# Patient Record
Sex: Male | Born: 1970 | Race: Asian | Hispanic: No | Marital: Married | Smoking: Current some day smoker
Health system: Southern US, Community
[De-identification: ages and names within clinical notes are randomized; demographics above are authoritative.]

## PROBLEM LIST (undated history)

## (undated) DIAGNOSIS — Z789 Other specified health status: Secondary | ICD-10-CM

---

## 1999-01-24 ENCOUNTER — Emergency Department (HOSPITAL_COMMUNITY): Admission: EM | Admit: 1999-01-24 | Discharge: 1999-01-24 | Payer: Self-pay | Admitting: Emergency Medicine

## 1999-01-24 ENCOUNTER — Encounter: Payer: Self-pay | Admitting: Emergency Medicine

## 1999-04-03 ENCOUNTER — Encounter: Payer: Self-pay | Admitting: Urology

## 1999-04-03 ENCOUNTER — Ambulatory Visit (HOSPITAL_COMMUNITY): Admission: RE | Admit: 1999-04-03 | Discharge: 1999-04-03 | Payer: Self-pay | Admitting: Urology

## 2009-07-02 ENCOUNTER — Encounter: Admission: RE | Admit: 2009-07-02 | Discharge: 2009-07-02 | Payer: Self-pay | Admitting: Family Medicine

## 2009-08-20 ENCOUNTER — Encounter: Admission: RE | Admit: 2009-08-20 | Discharge: 2009-08-20 | Payer: Self-pay | Admitting: Diagnostic Radiology

## 2009-08-27 ENCOUNTER — Encounter: Admission: RE | Admit: 2009-08-27 | Discharge: 2009-08-27 | Payer: Self-pay | Admitting: Interventional Radiology

## 2009-09-17 ENCOUNTER — Encounter: Admission: RE | Admit: 2009-09-17 | Discharge: 2009-09-17 | Payer: Self-pay | Admitting: Diagnostic Radiology

## 2009-09-24 ENCOUNTER — Encounter: Admission: RE | Admit: 2009-09-24 | Discharge: 2009-09-24 | Payer: Self-pay | Admitting: Interventional Radiology

## 2009-10-22 ENCOUNTER — Encounter: Admission: RE | Admit: 2009-10-22 | Discharge: 2009-10-22 | Payer: Self-pay | Admitting: Diagnostic Radiology

## 2009-11-19 ENCOUNTER — Encounter: Admission: RE | Admit: 2009-11-19 | Discharge: 2009-11-19 | Payer: Self-pay | Admitting: Diagnostic Radiology

## 2010-05-06 ENCOUNTER — Encounter: Admission: RE | Admit: 2010-05-06 | Discharge: 2010-05-06 | Payer: Self-pay | Admitting: Diagnostic Radiology

## 2010-11-09 ENCOUNTER — Encounter: Payer: Self-pay | Admitting: Diagnostic Radiology

## 2012-12-14 ENCOUNTER — Emergency Department (HOSPITAL_COMMUNITY)
Admission: EM | Admit: 2012-12-14 | Discharge: 2012-12-15 | Disposition: A | Discharge status: Home / Self Care | Payer: Managed Care, Other (non HMO) | Attending: Emergency Medicine | Admitting: Emergency Medicine

## 2012-12-14 ENCOUNTER — Encounter (HOSPITAL_COMMUNITY): Payer: Self-pay | Admitting: *Deleted

## 2012-12-14 ENCOUNTER — Emergency Department (HOSPITAL_COMMUNITY): Payer: Managed Care, Other (non HMO)

## 2012-12-14 DIAGNOSIS — Z87891 Personal history of nicotine dependence: Secondary | ICD-10-CM | POA: Insufficient documentation

## 2012-12-14 DIAGNOSIS — N200 Calculus of kidney: Secondary | ICD-10-CM | POA: Insufficient documentation

## 2012-12-14 LAB — URINALYSIS, ROUTINE W REFLEX MICROSCOPIC
Glucose, UA: NEGATIVE mg/dL
Ketones, ur: NEGATIVE mg/dL
Leukocytes, UA: NEGATIVE
Specific Gravity, Urine: 1.023 (ref 1.005–1.030)
Urobilinogen, UA: 0.2 mg/dL (ref 0.0–1.0)
pH: 6 (ref 5.0–8.0)

## 2012-12-14 LAB — CBC WITH DIFFERENTIAL/PLATELET
Basophils Absolute: 0 10*3/uL (ref 0.0–0.1)
Eosinophils Absolute: 0.6 10*3/uL (ref 0.0–0.7)
Eosinophils Relative: 4 % (ref 0–5)
Hemoglobin: 18.9 g/dL — ABNORMAL HIGH (ref 13.0–17.0)
Lymphocytes Relative: 12 % (ref 12–46)
MCHC: 36 g/dL (ref 30.0–36.0)
MCV: 87.8 fL (ref 78.0–100.0)
Monocytes Absolute: 1 10*3/uL (ref 0.1–1.0)
RBC: 5.98 MIL/uL — ABNORMAL HIGH (ref 4.22–5.81)
RDW: 12.3 % (ref 11.5–15.5)
WBC: 15.6 10*3/uL — ABNORMAL HIGH (ref 4.0–10.5)

## 2012-12-14 LAB — BASIC METABOLIC PANEL
BUN: 15 mg/dL (ref 6–23)
Creatinine, Ser: 1.05 mg/dL (ref 0.50–1.35)
GFR calc Af Amer: >90 mL/min (ref >90)
GFR calc non Af Amer: 85 mL/min — ABNORMAL LOW (ref >90)

## 2012-12-14 LAB — URINE MICROSCOPIC-ADD ON

## 2012-12-14 MED ORDER — HYDROMORPHONE HCL PF 1 MG/ML IJ SOLN: 0.5000 mg | Freq: Once | INTRAMUSCULAR | Status: DC

## 2012-12-14 MED ORDER — MORPHINE SULFATE 4 MG/ML IJ SOLN
4.0000 mg | Freq: Once | INTRAMUSCULAR | Status: AC
  Administered 2012-12-14: 4 mg via INTRAVENOUS
  Filled 2012-12-14: qty 1

## 2012-12-14 MED ORDER — ONDANSETRON HCL 4 MG/2ML IJ SOLN
4.0000 mg | Freq: Once | INTRAMUSCULAR | Status: AC
  Administered 2012-12-14: 4 mg via INTRAVENOUS
  Filled 2012-12-14: qty 2

## 2012-12-14 NOTE — ED Notes (Signed)
Provider held off on lab work.

## 2012-12-14 NOTE — ED Notes (Signed)
Per [place], [place].Waiting 30 minutes to see if pt's pain return before collecting blood sample.

## 2012-12-14 NOTE — ED Notes (Signed)
Per pt report: Pt reports pain on his left flank that began around 8pm this evening.  Pt reports having similar pain in the past and was dx with kidney stone.  The pain doesn't radiate.  Pt denies pain or burning upon urination or blood in urine.

## 2012-12-14 NOTE — ED Provider Notes (Signed)
History     CSN: [id number]  Arrival date & time 12/14/12  2149   First MD Initiated Contact with Patient 12/14/12 2214      Chief Complaint  Patient presents with  . Flank Pain    (Consider location/radiation/quality/duration/timing/severity/associated sxs/prior treatment) HPI Pt to the ED with complaints of left flank pain that started at 8pm while at work but has resolved right as he was placed in a room in the ED. He says that he has a history of kidney stones and this felt just like the same. No noticeable blood in urine, no dysuria, no vomiting, nausea, diarrhea or headache. He says that the pain was dull and stabbing and and a 8-9/10. NO he says his pain is a 0/10. He feels like he wants to go now and does not want any lab work done because the pain is  Gone. vss nad   History reviewed. No pertinent past medical history.  History reviewed. No pertinent past surgical history.  No family history on file.  History  Substance Use Topics  . Smoking status: Former Games developer  . Smokeless tobacco: Not on file  . Alcohol Use: Yes     Comment: "sometimes."      Review of Systems  Review of Systems  Gen: no weight loss, fevers, chills, night sweats  Eyes: no discharge or drainage, no occular pain or visual changes  Nose: no epistaxis or rhinorrhea  Mouth: no dental pain, no sore throat  Neck: no neck pain  Lungs:No wheezing, coughing or hemoptysis CV: no chest pain, palpitations, dependent edema or orthopnea  Abd: no abdominal pain, nausea, vomiting  GU: no dysuria or gross hematuria + flank pain MSK:  No abnormalities  Neuro: no headache, no focal neurologic deficits  Skin: no abnormalities Psyche: negative.   Allergies  Review of patient's allergies indicates no known allergies.  Home Medications   Current Outpatient Rx  Name  Route  Sig  Dispense  Refill  . acetaminophen (TYLENOL) 500 MG tablet   Oral   Take 500-1,000 mg by mouth every 6 (six) hours as needed  for pain.         Marland Kitchen ibuprofen (ADVIL,MOTRIN) 200 MG tablet   Oral   Take 200 mg by mouth every 6 (six) hours as needed for pain.         Marland Kitchen ondansetron (ZOFRAN) 4 MG tablet   Oral   Take 1 tablet (4 mg total) by mouth every 6 (six) hours.   12 tablet   0   . oxyCODONE-acetaminophen (PERCOCET/ROXICET) 5-325 MG per tablet   Oral   Take 1 tablet by mouth every 6 (six) hours as needed for pain.   15 tablet   0     BP 137/82  Pulse 69  Temp(Src) 98.1 F (36.7 C) (Oral)  Ht 5\' 5"  (1.651 m)  Wt 158 lb (71.668 kg)  BMI 26.29 kg/m2  SpO2 100%  Physical Exam  Nursing note and vitals reviewed. Constitutional: He appears well-developed and well-nourished. No distress.  HENT:  Head: Normocephalic and atraumatic.  Eyes: Pupils are equal, round, and reactive to light.  Neck: Normal range of motion. Neck supple.  Cardiovascular: Normal rate and regular rhythm.   Pulmonary/Chest: Effort normal.  Abdominal: Soft. There is no CVA tenderness.  Neurological: He is alert.  Skin: Skin is warm and dry.    ED Course  Procedures (including critical care time)  Labs Reviewed  URINALYSIS, ROUTINE W REFLEX MICROSCOPIC - Abnormal;  Notable for the following:    Hgb urine dipstick LARGE (*)    All other components within normal limits  CBC WITH DIFFERENTIAL - Abnormal; Notable for the following:    WBC 15.6 (*)    RBC 5.98 (*)    Hemoglobin 18.9 (*)    HCT 52.5 (*)    Neutro Abs 12.1 (*)    All other components within normal limits  BASIC METABOLIC PANEL - Abnormal; Notable for the following:    GFR calc non Af Amer 85 (*)    All other components within normal limits  URINE MICROSCOPIC-ADD ON - Abnormal; Notable for the following:    Crystals CA OXALATE CRYSTALS (*)    All other components within normal limits   Ct Abdomen Pelvis Wo Contrast  12/15/2012  *RADIOLOGY REPORT*  Clinical Data: 42 year old male with left flank, abdominal and pelvic pain.  CT ABDOMEN AND PELVIS WITHOUT  CONTRAST  Technique:  Multidetector CT imaging of the abdomen and pelvis was performed following the standard protocol without intravenous contrast.  Comparison: None  Findings: A 1 mm left UVJ calculus causes mild left hydroureteronephrosis. Cholelithiasis noted without CT evidence of acute cholecystitis.  The liver, spleen, pancreas, right kidney and adrenal glands are unremarkable.  Please note that parenchymal abnormalities may be missed as intravenous contrast was not administered.  No free fluid, enlarged lymph nodes, biliary dilation or abdominal aortic aneurysm identified.  The bowel and appendix are unremarkable except for a few scattered diverticula. Prostate enlargement is noted. No acute or suspicious bony abnormalities are present.  IMPRESSION: 1 mm left UVJ calculus causing mild left hydroureteronephrosis.  Cholelithiasis without CT evidence of acute cholecystitis.   Original Report Authenticated By: Harmon Pier, M.D.      1. Kidney stone       MDM  Patient doesn't want any labs or images done because he is in pain. We discussed that he may have kidney stones but we wont know for sure without doing any images. He asks that we wait 30 minutes to see if the pain comes back and if it does not he would like to leave. He says he will come back and do more tests and images if the pain comes back.  Will recheck patient after 30 minutes at 11pm.  11:15pm- the patients pain has returned. Pain medication ordered, labs and CT scan of ab/pelv wo contrast.   12:25- CT scan shows 1 mm stone in left UVJ. pts pain controlled. Strain all urine, pain and nausea medication with referral to Urology. Incidental finding of cholelithiasis. Discussed all this with pt.  Pt has been advised of the symptoms that warrant their return to the ED. Patient has voiced understanding and has agreed to follow-up with the PCP or specialist.      Dorthula Matas, PA 12/15/12 [phone]

## 2012-12-15 MED ORDER — ONDANSETRON HCL 4 MG PO TABS: 4.0000 mg | ORAL_TABLET | Freq: Four times a day (QID) | ORAL | Status: DC

## 2012-12-15 MED ORDER — OXYCODONE-ACETAMINOPHEN 5-325 MG PO TABS
2.0000 | ORAL_TABLET | Freq: Once | ORAL | Status: AC
  Administered 2012-12-15: 2 via ORAL
  Filled 2012-12-15: qty 2

## 2012-12-15 MED ORDER — SODIUM CHLORIDE 0.9 % IV BOLUS (SEPSIS): 1000.0000 mL | Freq: Once | INTRAVENOUS | Status: DC

## 2012-12-15 MED ORDER — OXYCODONE-ACETAMINOPHEN 5-325 MG PO TABS: 1.0000 | ORAL_TABLET | Freq: Four times a day (QID) | ORAL | Status: DC | PRN

## 2012-12-16 NOTE — ED Provider Notes (Signed)
Medical screening examination/treatment/procedure(s) were performed by non-physician practitioner and as supervising physician I was immediately available for consultation/collaboration.   Jaxxen Voong, MD 12/16/12 0031 

## 2013-06-15 ENCOUNTER — Emergency Department (HOSPITAL_COMMUNITY): Payer: Managed Care, Other (non HMO)

## 2013-06-15 ENCOUNTER — Observation Stay (HOSPITAL_COMMUNITY)
Admission: EM | Admit: 2013-06-15 | Discharge: 2013-06-17 | Disposition: A | Discharge status: Home / Self Care | Payer: Managed Care, Other (non HMO) | Attending: General Surgery | Admitting: General Surgery

## 2013-06-15 ENCOUNTER — Encounter (HOSPITAL_COMMUNITY): Payer: Self-pay | Admitting: *Deleted

## 2013-06-15 DIAGNOSIS — K819 Cholecystitis, unspecified: Secondary | ICD-10-CM

## 2013-06-15 DIAGNOSIS — K8010 Calculus of gallbladder with chronic cholecystitis without obstruction: Principal | ICD-10-CM | POA: Insufficient documentation

## 2013-06-15 HISTORY — DX: Other specified health status: Z78.9

## 2013-06-15 LAB — CBC WITH DIFFERENTIAL/PLATELET
Basophils Absolute: 0 10*3/uL (ref 0.0–0.1)
Eosinophils Absolute: 0.5 10*3/uL (ref 0.0–0.7)
Eosinophils Relative: 5 % (ref 0–5)
Lymphs Abs: 2.6 10*3/uL (ref 0.7–4.0)
MCH: 30.9 pg (ref 26.0–34.0)
MCV: 87.3 fL (ref 78.0–100.0)
Monocytes Absolute: 0.6 10*3/uL (ref 0.1–1.0)
Platelets: 219 10*3/uL (ref 150–400)
RDW: 12.3 % (ref 11.5–15.5)

## 2013-06-15 LAB — URINALYSIS, ROUTINE W REFLEX MICROSCOPIC
Hgb urine dipstick: NEGATIVE
Leukocytes, UA: NEGATIVE
Nitrite: NEGATIVE
Protein, ur: NEGATIVE mg/dL
Specific Gravity, Urine: 1.032 — ABNORMAL HIGH (ref 1.005–1.030)
Urobilinogen, UA: 0.2 mg/dL (ref 0.0–1.0)

## 2013-06-15 LAB — COMPREHENSIVE METABOLIC PANEL
ALT: 32 U/L (ref 0–53)
Calcium: 9.6 mg/dL (ref 8.4–10.5)
Creatinine, Ser: 0.66 mg/dL (ref 0.50–1.35)
GFR calc Af Amer: >90 mL/min (ref >90)
Glucose, Bld: 134 mg/dL — ABNORMAL HIGH (ref 70–99)
Sodium: 138 mEq/L (ref 135–145)
Total Protein: 7 g/dL (ref 6.0–8.3)

## 2013-06-15 MED ORDER — KCL IN DEXTROSE-NACL 20-5-0.9 MEQ/L-%-% IV SOLN
INTRAVENOUS | Status: DC
  Administered 2013-06-15 – 2013-06-16 (×2): via INTRAVENOUS
  Filled 2013-06-15 (×5): qty 1000

## 2013-06-15 MED ORDER — ONDANSETRON HCL 4 MG/2ML IJ SOLN: 4.0000 mg | Freq: Four times a day (QID) | INTRAMUSCULAR | Status: DC | PRN

## 2013-06-15 MED ORDER — HYDROMORPHONE HCL PF 1 MG/ML IJ SOLN
1.0000 mg | INTRAMUSCULAR | Status: DC | PRN
  Administered 2013-06-16: 1 mg via INTRAVENOUS
  Filled 2013-06-15: qty 1

## 2013-06-15 MED ORDER — ENOXAPARIN SODIUM 40 MG/0.4ML ~~LOC~~ SOLN
40.0000 mg | SUBCUTANEOUS | Status: DC
  Administered 2013-06-15: 40 mg via SUBCUTANEOUS
  Filled 2013-06-15 (×2): qty 0.4

## 2013-06-15 MED ORDER — CIPROFLOXACIN IN D5W 400 MG/200ML IV SOLN
400.0000 mg | Freq: Two times a day (BID) | INTRAVENOUS | Status: DC
  Administered 2013-06-15: 400 mg via INTRAVENOUS
  Filled 2013-06-15 (×3): qty 200

## 2013-06-15 MED ORDER — OXYCODONE HCL 5 MG PO TABS: 5.0000 mg | ORAL_TABLET | ORAL | Status: DC | PRN

## 2013-06-15 NOTE — ED Notes (Signed)
[facility] faxed over pt's paper work from today's visit

## 2013-06-15 NOTE — ED Provider Notes (Signed)
CSN: [id number]     Arrival date & time 06/15/13  1443 History   First MD Initiated Contact with Patient 06/15/13 1531     Chief Complaint  Patient presents with  . Abdominal Pain   (Consider location/radiation/quality/duration/timing/severity/associated sxs/prior Treatment) Patient is a 42 y.o. male presenting with abdominal pain. The history is provided by the patient.  Abdominal Pain Pain location:  Epigastric Associated symptoms: no chest pain, no diarrhea, no nausea, no shortness of breath and no vomiting    patient with upper abdominal pain that began this morning after eating. It was dull and then resolved. He had another episode after sleeping. He has not eaten since this morning. He had seen at [facility] clinic and sent here. He had a white count of 14 there. No nausea vomiting. No fevers. She had a previous CAT scan that showed a gallstone. He states the pain is improved. No blood in stool. No fevers.  Past Medical History  Diagnosis Date  . Medical history non-contributory    Past Surgical History  Procedure Laterality Date  . No past surgeries     History reviewed. No pertinent family history. History  Substance Use Topics  . Smoking status: Former Smoker    Types: Cigarettes    Quit date: 06/15/2009  . Smokeless tobacco: Never Used  . Alcohol Use: Yes     Comment: "sometimes."    Review of Systems  Constitutional: Negative for activity change and appetite change.  HENT: Negative for neck stiffness.   Eyes: Negative for pain.  Respiratory: Negative for chest tightness and shortness of breath.   Cardiovascular: Negative for chest pain and leg swelling.  Gastrointestinal: Positive for abdominal pain. Negative for nausea, vomiting and diarrhea.  Genitourinary: Negative for flank pain.  Musculoskeletal: Negative for back pain.  Skin: Negative for rash.  Neurological: Negative for weakness, numbness and headaches.  Psychiatric/Behavioral: Negative for behavioral  problems.    Allergies  Review of patient's allergies indicates no known allergies.  Home Medications  No current outpatient prescriptions on file. BP 124/76  Pulse 63  Temp(Src) 98.1 F (36.7 C) (Oral)  Resp 18  Ht 5\' 5"  (1.651 m)  Wt 150 lb 12.7 oz (68.4 kg)  BMI 25.09 kg/m2  SpO2 100% Physical Exam  Nursing note and vitals reviewed. Constitutional: He is oriented to person, place, and time. He appears well-developed and well-nourished.  HENT:  Head: Normocephalic and atraumatic.  Eyes: EOM are normal. Pupils are equal, round, and reactive to light.  Neck: Normal range of motion. Neck supple.  Cardiovascular: Normal rate, regular rhythm and normal heart sounds.   No murmur heard. Pulmonary/Chest: Effort normal and breath sounds normal.  Abdominal: Soft. Bowel sounds are normal. He exhibits no distension and no mass. There is no tenderness. There is no rebound and no guarding.  Musculoskeletal: Normal range of motion. He exhibits no edema.  Neurological: He is alert and oriented to person, place, and time. No cranial nerve deficit.  Skin: Skin is warm and dry.  Psychiatric: He has a normal mood and affect.    ED Course  Procedures (including critical care time) Labs Review Labs Reviewed  COMPREHENSIVE METABOLIC PANEL - Abnormal; Notable for the following:    Potassium 3.3 (*)    Glucose, Bld 134 (*)    Alkaline Phosphatase 33 (*)    All other components within normal limits  URINALYSIS, ROUTINE W REFLEX MICROSCOPIC - Abnormal; Notable for the following:    Specific Gravity, Urine 1.032 (*)  All other components within normal limits  SURGICAL PCR SCREEN  CBC WITH DIFFERENTIAL  LIPASE, BLOOD  CBC  COMPREHENSIVE METABOLIC PANEL   Imaging Review US Abdomen Complete  06/15/2013   *RADIOLOGY REPORT*  Clinical Data:  Right upper quadrant abdominal pain.  COMPLETE ABDOMINAL ULTRASOUND  Comparison:  Unenhanced CT abdomen and pelvis 12/14/2012.  Findings:  Gallbladder:   Multiple shadowing gallstones, the largest approximating 1.7 cm.  Gallbladder wall thickening up to approximately 5 mm.  No pericholecystic fluid.  Negative sonographic Murphy's sign according to the ultrasound technologist.  Common bile duct:  Normal in caliber with maximum diameter approximating 3 mm.  Possible stone in the common hepatic duct as it enters the liver, as there is an echogenic focus in this location.  Liver:  Normal size and echotexture without focal parenchymal abnormality.  Patent portal vein with hepatopetal flow.  IVC:  Patent in its intrahepatic portion.  Obscured outside the liver by bowel gas.  Pancreas:  Obscured by overlying bowel gas and therefore not evaluated.  Spleen:  Normal size and echotexture without focal parenchymal abnormality.  Right Kidney:  No hydronephrosis.  Well-preserved cortex.  No shadowing calculi.  Normal size and parenchymal echotexture without focal abnormalities.  Approximately 11.2 cm in length.  Left Kidney:  No hydronephrosis.  Well-preserved cortex.  No shadowing calculi.  Normal size and parenchymal echotexture without focal abnormalities.  Approximately 11.8 cm length.  Abdominal aorta:  Normal in caliber proximally, though obscured in its mid and distal portion by overlying bowel gas.  IMPRESSION:  1.  Cholelithiasis.  Gallbladder wall thickening is consistent with cholecystitis. 2.  No biliary ductal dilation, though there may be a stone in the common hepatic duct. 3.  Otherwise normal examination with a caveat that the pancreas, the extrahepatic IVC and the mid and distal abdominal aorta were obscured by overlying bowel gas and were therefore not evaluated.   Original Report Authenticated By: Hulan Saas, M.D.    MDM   1. Cholecystitis    Patient with abdominal pain. No longer tender. Ultrasound shows cholecystitis. Will be admitted by general surgery    Juliet Rude. Rubin Payor, MD 06/16/13 [phone]

## 2013-06-15 NOTE — ED Notes (Signed)
Pt c/o abd pain "all over" and painful urination. Was seen at [facility] today, pt states he is having surgery for his gallbladder. States physician from [facility] sent him here and surgeon would be here to evaluate him.

## 2013-06-15 NOTE — H&P (Signed)
Clinton Castillo is an 42 y.o. male.   Chief Complaint: abdominal pain HPI: asked to see pt at request of Dr Rubin Payor for RUQ abdominal pain.  Started today. Positive nausea and vomiting.  Feels better now.  U/S shows GB wall thickening and gallstones.  Question CBD/ CHD stone.   Past Medical History  Diagnosis Date  . Medical history non-contributory     Past Surgical History  Procedure Laterality Date  . No past surgeries      History reviewed. No pertinent family history. Social History:  reports that he quit smoking about 4 years ago. His smoking use included Cigarettes. He smoked 0.00 packs per day. He has never used smokeless tobacco. He reports that  drinks alcohol. He reports that he does not use illicit drugs.  Allergies: No Known Allergies   (Not in a hospital admission)  Results for orders placed during the hospital encounter of 06/15/13 (from the past 48 hour(s))  URINALYSIS, ROUTINE W REFLEX MICROSCOPIC     Status: Abnormal   Collection Time    06/15/13  3:54 PM      Result Value Range   Color, Urine YELLOW  YELLOW   APPearance CLEAR  CLEAR   Specific Gravity, Urine 1.032 (*) 1.005 - 1.030   pH 5.5  5.0 - 8.0   Glucose, UA NEGATIVE  NEGATIVE mg/dL   Hgb urine dipstick NEGATIVE  NEGATIVE   Bilirubin Urine NEGATIVE  NEGATIVE   Ketones, ur NEGATIVE  NEGATIVE mg/dL   Protein, ur NEGATIVE  NEGATIVE mg/dL   Urobilinogen, UA 0.2  0.0 - 1.0 mg/dL   Nitrite NEGATIVE  NEGATIVE   Leukocytes, UA NEGATIVE  NEGATIVE   Comment: MICROSCOPIC NOT DONE ON URINES WITH NEGATIVE PROTEIN, BLOOD, LEUKOCYTES, NITRITE, OR GLUCOSE <1000 mg/dL.  CBC WITH DIFFERENTIAL     Status: None   Collection Time    06/15/13  4:07 PM      Result Value Range   WBC 9.7  4.0 - 10.5 K/uL   RBC 5.50  4.22 - 5.81 MIL/uL   Hemoglobin 17.0  13.0 - 17.0 g/dL   HCT 29.5  62.1 - 30.8 %   MCV 87.3  78.0 - 100.0 fL   MCH 30.9  26.0 - 34.0 pg   MCHC 35.4  30.0 - 36.0 g/dL   RDW 65.7  84.6 - 96.2 %   Platelets  219  150 - 400 K/uL   Neutrophils Relative % 62  43 - 77 %   Neutro Abs 6.0  1.7 - 7.7 K/uL   Lymphocytes Relative 27  12 - 46 %   Lymphs Abs 2.6  0.7 - 4.0 K/uL   Monocytes Relative 6  3 - 12 %   Monocytes Absolute 0.6  0.1 - 1.0 K/uL   Eosinophils Relative 5  0 - 5 %   Eosinophils Absolute 0.5  0.0 - 0.7 K/uL   Basophils Relative 0  0 - 1 %   Basophils Absolute 0.0  0.0 - 0.1 K/uL  COMPREHENSIVE METABOLIC PANEL     Status: Abnormal   Collection Time    06/15/13  4:07 PM      Result Value Range   Sodium 138  135 - 145 mEq/L   Potassium 3.3 (*) 3.5 - 5.1 mEq/L   Chloride 104  96 - 112 mEq/L   CO2 27  19 - 32 mEq/L   Glucose, Bld 134 (*) 70 - 99 mg/dL   BUN 12  6 - 23  mg/dL   Creatinine, Ser 1.61  0.50 - 1.35 mg/dL   Calcium 9.6  8.4 - 09.6 mg/dL   Total Protein 7.0  6.0 - 8.3 g/dL   Albumin 3.8  3.5 - 5.2 g/dL   AST 28  0 - 37 U/L   ALT 32  0 - 53 U/L   Alkaline Phosphatase 33 (*) 39 - 117 U/L   Total Bilirubin 0.5  0.3 - 1.2 mg/dL   GFR calc non Af Amer >90  >90 mL/min   GFR calc Af Amer >90  >90 mL/min   Comment: (NOTE)     The eGFR has been calculated using the CKD EPI equation.     This calculation has not been validated in all clinical situations.     eGFR's persistently <90 mL/min signify possible Chronic Kidney     Disease.  LIPASE, BLOOD     Status: None   Collection Time    06/15/13  4:07 PM      Result Value Range   Lipase 40  11 - 59 U/L   US Abdomen Complete  06/15/2013   *RADIOLOGY REPORT*  Clinical Data:  Right upper quadrant abdominal pain.  COMPLETE ABDOMINAL ULTRASOUND  Comparison:  Unenhanced CT abdomen and pelvis 12/14/2012.  Findings:  Gallbladder:  Multiple shadowing gallstones, the largest approximating 1.7 cm.  Gallbladder wall thickening up to approximately 5 mm.  No pericholecystic fluid.  Negative sonographic Murphy's sign according to the ultrasound technologist.  Common bile duct:  Normal in caliber with maximum diameter approximating 3 mm.   Possible stone in the common hepatic duct as it enters the liver, as there is an echogenic focus in this location.  Liver:  Normal size and echotexture without focal parenchymal abnormality.  Patent portal vein with hepatopetal flow.  IVC:  Patent in its intrahepatic portion.  Obscured outside the liver by bowel gas.  Pancreas:  Obscured by overlying bowel gas and therefore not evaluated.  Spleen:  Normal size and echotexture without focal parenchymal abnormality.  Right Kidney:  No hydronephrosis.  Well-preserved cortex.  No shadowing calculi.  Normal size and parenchymal echotexture without focal abnormalities.  Approximately 11.2 cm in length.  Left Kidney:  No hydronephrosis.  Well-preserved cortex.  No shadowing calculi.  Normal size and parenchymal echotexture without focal abnormalities.  Approximately 11.8 cm length.  Abdominal aorta:  Normal in caliber proximally, though obscured in its mid and distal portion by overlying bowel gas.  IMPRESSION:  1.  Cholelithiasis.  Gallbladder wall thickening is consistent with cholecystitis. 2.  No biliary ductal dilation, though there may be a stone in the common hepatic duct. 3.  Otherwise normal examination with a caveat that the pancreas, the extrahepatic IVC and the mid and distal abdominal aorta were obscured by overlying bowel gas and were therefore not evaluated.   Original Report Authenticated By: Hulan Saas, M.D.    Review of Systems  Constitutional: Negative.   Eyes: Negative.   Respiratory: Negative.   Gastrointestinal: Positive for abdominal pain.  Genitourinary: Negative.   Musculoskeletal: Negative.   Skin: Negative.   Neurological: Negative.   Endo/Heme/Allergies: Negative.   Psychiatric/Behavioral: Negative.     Blood pressure 104/61, pulse 57, temperature 97.8 F (36.6 C), temperature source Oral, resp. rate 16, SpO2 98.00%. Physical Exam  Constitutional: He is oriented to person, place, and time. He appears well-developed and  well-nourished.  HENT:  Head: Normocephalic and atraumatic.  Eyes: EOM are normal. Pupils are equal, round, and reactive to  light. No scleral icterus.  Neck: Normal range of motion. Neck supple.  Cardiovascular: Normal rate and regular rhythm.   Respiratory: Effort normal and breath sounds normal.  GI: There is tenderness in the right upper quadrant.  Musculoskeletal: Normal range of motion.  Neurological: He is alert and oriented to person, place, and time.  Skin: Skin is warm and dry.  Psychiatric: He has a normal mood and affect. His behavior is normal. Judgment and thought content normal.     Assessment/Plan Acute cholecystitis Possible CBD/ CHD STONE Admit/ IVF/ NPO AFTER MIDNIGHT LAP CHOLE IN AM WITH CHOLANGIOGRAM  Clinton Castillo A. 06/15/2013, 7:48 PM

## 2013-06-16 ENCOUNTER — Inpatient Hospital Stay (HOSPITAL_COMMUNITY): Payer: Managed Care, Other (non HMO)

## 2013-06-16 ENCOUNTER — Encounter (HOSPITAL_COMMUNITY)
Admission: EM | Disposition: A | Discharge status: Home / Self Care | Payer: Self-pay | Source: Home / Self Care | Attending: Emergency Medicine

## 2013-06-16 ENCOUNTER — Encounter (HOSPITAL_COMMUNITY): Payer: Self-pay

## 2013-06-16 ENCOUNTER — Encounter (HOSPITAL_COMMUNITY): Payer: Self-pay | Admitting: Anesthesiology

## 2013-06-16 ENCOUNTER — Inpatient Hospital Stay (HOSPITAL_COMMUNITY): Payer: Managed Care, Other (non HMO) | Admitting: Anesthesiology

## 2013-06-16 HISTORY — PX: CHOLECYSTECTOMY: SHX55

## 2013-06-16 LAB — COMPREHENSIVE METABOLIC PANEL
ALT: 30 U/L (ref 0–53)
AST: 24 U/L (ref 0–37)
Albumin: 3.7 g/dL (ref 3.5–5.2)
CO2: 26 mEq/L (ref 19–32)
Chloride: 105 mEq/L (ref 96–112)
GFR calc non Af Amer: >90 mL/min (ref >90)
Potassium: 3.7 mEq/L (ref 3.5–5.1)
Sodium: 137 mEq/L (ref 135–145)
Total Bilirubin: 0.8 mg/dL (ref 0.3–1.2)

## 2013-06-16 LAB — CBC
Platelets: 228 10*3/uL (ref 150–400)
RBC: 5.53 MIL/uL (ref 4.22–5.81)
RDW: 12.4 % (ref 11.5–15.5)
WBC: 8.8 10*3/uL (ref 4.0–10.5)

## 2013-06-16 LAB — SURGICAL PCR SCREEN: Staphylococcus aureus: POSITIVE — AB

## 2013-06-16 SURGERY — LAPAROSCOPIC CHOLECYSTECTOMY WITH INTRAOPERATIVE CHOLANGIOGRAM
Anesthesia: General | Wound class: Clean Contaminated

## 2013-06-16 MED ORDER — BUPIVACAINE-EPINEPHRINE 0.25% -1:200000 IJ SOLN
INTRAMUSCULAR | Status: DC | PRN
  Administered 2013-06-16: 5 mL

## 2013-06-16 MED ORDER — SODIUM CHLORIDE 0.9 % IV SOLN: INTRAVENOUS | Status: DC

## 2013-06-16 MED ORDER — PROMETHAZINE HCL 25 MG/ML IJ SOLN: 6.2500 mg | INTRAMUSCULAR | Status: DC | PRN

## 2013-06-16 MED ORDER — ONDANSETRON HCL 4 MG/2ML IJ SOLN
INTRAMUSCULAR | Status: DC | PRN
  Administered 2013-06-16: 4 mg via INTRAVENOUS

## 2013-06-16 MED ORDER — GLYCOPYRROLATE 0.2 MG/ML IJ SOLN
INTRAMUSCULAR | Status: DC | PRN
  Administered 2013-06-16: 0.4 mg via INTRAVENOUS

## 2013-06-16 MED ORDER — OXYCODONE HCL 5 MG/5ML PO SOLN
5.0000 mg | Freq: Once | ORAL | Status: DC | PRN
  Filled 2013-06-16: qty 5

## 2013-06-16 MED ORDER — OXYCODONE-ACETAMINOPHEN 5-325 MG PO TABS
1.0000 | ORAL_TABLET | ORAL | Status: DC | PRN
  Administered 2013-06-16 – 2013-06-17 (×2): 1 via ORAL
  Filled 2013-06-16 (×2): qty 1

## 2013-06-16 MED ORDER — FENTANYL CITRATE 0.05 MG/ML IJ SOLN
INTRAMUSCULAR | Status: DC | PRN
  Administered 2013-06-16 (×2): 100 ug via INTRAVENOUS
  Administered 2013-06-16: 50 ug via INTRAVENOUS

## 2013-06-16 MED ORDER — DEXTROSE 5 % IV SOLN
1.0000 g | INTRAVENOUS | Status: DC | PRN
  Administered 2013-06-16: 2 g via INTRAVENOUS

## 2013-06-16 MED ORDER — MIDAZOLAM HCL 5 MG/5ML IJ SOLN
INTRAMUSCULAR | Status: DC | PRN
  Administered 2013-06-16: 2 mg via INTRAVENOUS

## 2013-06-16 MED ORDER — SUCCINYLCHOLINE CHLORIDE 20 MG/ML IJ SOLN
INTRAMUSCULAR | Status: DC | PRN
  Administered 2013-06-16: 100 mg via INTRAVENOUS

## 2013-06-16 MED ORDER — HYDROMORPHONE HCL PF 1 MG/ML IJ SOLN: 0.2500 mg | INTRAMUSCULAR | Status: DC | PRN

## 2013-06-16 MED ORDER — LACTATED RINGERS IR SOLN
Status: DC | PRN
  Administered 2013-06-16: 1

## 2013-06-16 MED ORDER — OXYCODONE HCL 5 MG PO TABS: 5.0000 mg | ORAL_TABLET | Freq: Once | ORAL | Status: DC | PRN

## 2013-06-16 MED ORDER — LACTATED RINGERS IV SOLN
INTRAVENOUS | Status: DC
  Administered 2013-06-16: 1000 mL via INTRAVENOUS
  Administered 2013-06-16: 11:00:00 via INTRAVENOUS

## 2013-06-16 MED ORDER — ROCURONIUM BROMIDE 100 MG/10ML IV SOLN
INTRAVENOUS | Status: DC | PRN
  Administered 2013-06-16: 35 mg via INTRAVENOUS

## 2013-06-16 MED ORDER — DEXAMETHASONE SODIUM PHOSPHATE 10 MG/ML IJ SOLN
INTRAMUSCULAR | Status: DC | PRN
  Administered 2013-06-16: 10 mg via INTRAVENOUS

## 2013-06-16 MED ORDER — PROPOFOL 10 MG/ML IV BOLUS
INTRAVENOUS | Status: DC | PRN
  Administered 2013-06-16: 200 mg via INTRAVENOUS

## 2013-06-16 MED ORDER — HYDROMORPHONE HCL PF 1 MG/ML IJ SOLN
INTRAMUSCULAR | Status: DC | PRN
  Administered 2013-06-16: .5 mg via INTRAVENOUS
  Administered 2013-06-16: 0.5 mg via INTRAVENOUS

## 2013-06-16 MED ORDER — SODIUM CHLORIDE 0.9 % IJ SOLN
INTRAMUSCULAR | Status: DC | PRN
  Administered 2013-06-16: 12:00:00

## 2013-06-16 MED ORDER — MEPERIDINE HCL 50 MG/ML IJ SOLN: 6.2500 mg | INTRAMUSCULAR | Status: DC | PRN

## 2013-06-16 MED ORDER — ENOXAPARIN SODIUM 40 MG/0.4ML ~~LOC~~ SOLN
40.0000 mg | SUBCUTANEOUS | Status: DC
  Administered 2013-06-16: 40 mg via SUBCUTANEOUS
  Filled 2013-06-16 (×2): qty 0.4

## 2013-06-16 MED ORDER — 0.9 % SODIUM CHLORIDE (POUR BTL) OPTIME
TOPICAL | Status: DC | PRN
  Administered 2013-06-16: 1000 mL

## 2013-06-16 MED ORDER — NEOSTIGMINE METHYLSULFATE 1 MG/ML IJ SOLN
INTRAMUSCULAR | Status: DC | PRN
  Administered 2013-06-16: 3 mg via INTRAVENOUS

## 2013-06-16 MED ORDER — LIDOCAINE HCL (CARDIAC) 20 MG/ML IV SOLN
INTRAVENOUS | Status: DC | PRN
  Administered 2013-06-16: 100 mg via INTRAVENOUS

## 2013-06-16 SURGICAL SUPPLY — 32 items
APPLIER CLIP 5 13 M/L LIGAMAX5 (MISCELLANEOUS) ×2
CABLE HIGH FREQUENCY MONO STRZ (ELECTRODE) ×2 IMPLANT
CANISTER SUCTION 2500CC (MISCELLANEOUS) ×2 IMPLANT
CHLORAPREP W/TINT 26ML (MISCELLANEOUS) ×2 IMPLANT
CLIP APPLIE 5 13 M/L LIGAMAX5 (MISCELLANEOUS) ×1 IMPLANT
CLOTH BEACON ORANGE TIMEOUT ST (SAFETY) ×2 IMPLANT
COVER MAYO STAND STRL (DRAPES) ×2 IMPLANT
DERMABOND ADVANCED (GAUZE/BANDAGES/DRESSINGS) ×1
DERMABOND ADVANCED .7 DNX12 (GAUZE/BANDAGES/DRESSINGS) ×1 IMPLANT
DRAPE C-ARM 42X120 X-RAY (DRAPES) ×2 IMPLANT
DRAPE LAPAROSCOPIC ABDOMINAL (DRAPES) ×2 IMPLANT
DRAPE UTILITY XL STRL (DRAPES) ×2 IMPLANT
ELECT REM PT RETURN 9FT ADLT (ELECTROSURGICAL) ×2
ELECTRODE REM PT RTRN 9FT ADLT (ELECTROSURGICAL) ×1 IMPLANT
GOWN BRE IMP PREV XXLGXLNG (GOWN DISPOSABLE) ×2 IMPLANT
GOWN STRL REIN XL XLG (GOWN DISPOSABLE) ×4 IMPLANT
HEMOSTAT SNOW SURGICEL 2X4 (HEMOSTASIS) ×2 IMPLANT
KIT BASIN OR (CUSTOM PROCEDURE TRAY) ×2 IMPLANT
NS IRRIG 1000ML POUR BTL (IV SOLUTION) ×2 IMPLANT
POUCH SPECIMEN RETRIEVAL 10MM (ENDOMECHANICALS) ×2 IMPLANT
SCISSORS ENDO CVD 5DCS (MISCELLANEOUS) ×2 IMPLANT
SET CHOLANGIOGRAPH MIX (MISCELLANEOUS) ×2 IMPLANT
SET IRRIG TUBING LAPAROSCOPIC (IRRIGATION / IRRIGATOR) ×2 IMPLANT
SOLUTION ANTI FOG 6CC (MISCELLANEOUS) ×2 IMPLANT
SUT MNCRL AB 4-0 PS2 18 (SUTURE) ×2 IMPLANT
TOWEL OR 17X26 10 PK STRL BLUE (TOWEL DISPOSABLE) ×2 IMPLANT
TOWEL OR NON WOVEN STRL DISP B (DISPOSABLE) ×2 IMPLANT
TRAY LAP CHOLE (CUSTOM PROCEDURE TRAY) ×2 IMPLANT
TROCAR BLADELESS OPT 5 75 (ENDOMECHANICALS) ×2 IMPLANT
TROCAR SLEEVE XCEL 5X75 (ENDOMECHANICALS) ×4 IMPLANT
TROCAR XCEL BLUNT TIP 100MML (ENDOMECHANICALS) ×2 IMPLANT
TUBING INSUFFLATION 10FT LAP (TUBING) ×2 IMPLANT

## 2013-06-16 NOTE — Progress Notes (Signed)
Pt arrived unit post Lap Choley. Pt is alert and oriented, VSS, Will continue with current plan of care.

## 2013-06-16 NOTE — Interval H&P Note (Signed)
History and Physical Interval Note:  06/16/2013 10:32 AM  Clinton Castillo  has presented today for surgery, with the diagnosis of cholelithiasis  The various methods of treatment have been discussed with the patient and family. After consideration of risks, benefits and other options for treatment, the patient has consented to  Procedure(s): LAPAROSCOPIC CHOLECYSTECTOMY WITH INTRAOPERATIVE CHOLANGIOGRAM (N/A) as a surgical intervention .  The patient's history has been reviewed, patient examined, no change in status, stable for surgery.  I have reviewed the patient's chart and labs.  Questions were answered to the patient's satisfaction.    The anatomy & physiology of hepatobiliary & pancreatic function was discussed.  The pathophysiology of gallbladder dysfunction was discussed.  Natural history risks without surgery was discussed.   I feel the risks of no intervention will lead to serious problems that outweigh the operative risks; therefore, I recommended cholecystectomy to remove the pathology.  I explained laparoscopic techniques with possible need for an open approach.  Probable cholangiogram to evaluate the bilary tract was explained as well.    Risks such as bleeding, infection, abscess, leak, injury to other organs, need for further treatment, heart attack, death, and other risks were discussed.  I noted a good likelihood this will help address the problem.  Possibility that this will not correct all abdominal symptoms was explained.  Goals of post-operative recovery were discussed as well.  We will work to minimize complications.  An educational handout further explaining the pathology and treatment options was given as well.  Questions were answered.  The patient expresses understanding & wishes to proceed with surgery.  Vanita Panda, MD  Colorectal and General Surgery [facility]

## 2013-06-16 NOTE — Progress Notes (Signed)
Patient ID: Clinton Castillo, male   DOB: 1971/02/25, 42 y.o.   MRN: [id number]    Subjective: No pain today, abd feels normal, denies n/v, ready for surgery today  Objective: Vital signs in last 24 hours: Temp:  [97.5 F (36.4 C)-98.3 F (36.8 C)] 98.2 F (36.8 C) (08/29 0541) Pulse Rate:  [56-77] 56 (08/29 0541) Resp:  [16-18] 18 (08/29 0541) BP: (101-124)/(56-76) 101/56 mmHg (08/29 0541) SpO2:  [96 %-100 %] 97 % (08/29 0541) Weight:  [150 lb 12.7 oz (68.4 kg)] 150 lb 12.7 oz (68.4 kg) (08/28 2057) Last BM Date: 06/14/13  Intake/Output from previous day:   Intake/Output this shift:    PE: Abd: soft, nontender, +BS General: NAD Heart: RRR Lungs: CTA bil  Lab Results:   Recent Labs  06/15/13 1607 06/16/13 0418  WBC 9.7 8.8  HGB 17.0 16.9  HCT 48.0 48.4  PLT 219 228   BMET  Recent Labs  06/15/13 1607 06/16/13 0418  NA 138 137  K 3.3* 3.7  CL 104 105  CO2 27 26  GLUCOSE 134* 107*  BUN 12 9  CREATININE 0.66 0.74  CALCIUM 9.6 9.2   PT/INR No results found for this basename: LABPROT, INR,  in the last 72 hours CMP     Component Value Date/Time   NA 137 06/16/2013 0418   K 3.7 06/16/2013 0418   CL 105 06/16/2013 0418   CO2 26 06/16/2013 0418   GLUCOSE 107* 06/16/2013 0418   BUN 9 06/16/2013 0418   CREATININE 0.74 06/16/2013 0418   CALCIUM 9.2 06/16/2013 0418   PROT 6.6 06/16/2013 0418   ALBUMIN 3.7 06/16/2013 0418   AST 24 06/16/2013 0418   ALT 30 06/16/2013 0418   ALKPHOS 31* 06/16/2013 0418   BILITOT 0.8 06/16/2013 0418   GFRNONAA >90 06/16/2013 0418   GFRAA >90 06/16/2013 0418   Lipase     Component Value Date/Time   LIPASE 40 06/15/2013 1607       Studies/Results: US Abdomen Complete  06/15/2013   *RADIOLOGY REPORT*  Clinical Data:  Right upper quadrant abdominal pain.  COMPLETE ABDOMINAL ULTRASOUND  Comparison:  Unenhanced CT abdomen and pelvis 12/14/2012.  Findings:  Gallbladder:  Multiple shadowing gallstones, the largest approximating 1.7 cm.   Gallbladder wall thickening up to approximately 5 mm.  No pericholecystic fluid.  Negative sonographic Murphy's sign according to the ultrasound technologist.  Common bile duct:  Normal in caliber with maximum diameter approximating 3 mm.  Possible stone in the common hepatic duct as it enters the liver, as there is an echogenic focus in this location.  Liver:  Normal size and echotexture without focal parenchymal abnormality.  Patent portal vein with hepatopetal flow.  IVC:  Patent in its intrahepatic portion.  Obscured outside the liver by bowel gas.  Pancreas:  Obscured by overlying bowel gas and therefore not evaluated.  Spleen:  Normal size and echotexture without focal parenchymal abnormality.  Right Kidney:  No hydronephrosis.  Well-preserved cortex.  No shadowing calculi.  Normal size and parenchymal echotexture without focal abnormalities.  Approximately 11.2 cm in length.  Left Kidney:  No hydronephrosis.  Well-preserved cortex.  No shadowing calculi.  Normal size and parenchymal echotexture without focal abnormalities.  Approximately 11.8 cm length.  Abdominal aorta:  Normal in caliber proximally, though obscured in its mid and distal portion by overlying bowel gas.  IMPRESSION:  1.  Cholelithiasis.  Gallbladder wall thickening is consistent with cholecystitis. 2.  No biliary ductal dilation, though there  may be a stone in the common hepatic duct. 3.  Otherwise normal examination with a caveat that the pancreas, the extrahepatic IVC and the mid and distal abdominal aorta were obscured by overlying bowel gas and were therefore not evaluated.   Original Report Authenticated By: Hulan Saas, M.D.    Anti-infectives: Anti-infectives   Start     Dose/Rate Route Frequency Ordered Stop   06/15/13 2100  ciprofloxacin (CIPRO) IVPB 400 mg     400 mg 200 mL/hr over 60 Minutes Intravenous Every 12 hours 06/15/13 1956         Assessment/Plan Acute cholecystitis,Possible CBD/ CHD STONE: for OR today,  consent written, questions answered, pt NPO.  Likely home tomorrow.   LOS: 1 day    Elmire Amrein 06/16/2013

## 2013-06-16 NOTE — Transfer of Care (Signed)
Immediate Anesthesia Transfer of Care Note  Patient: Clinton Castillo  Procedure(s) Performed: Procedure(s): LAPAROSCOPIC CHOLECYSTECTOMY WITH INTRAOPERATIVE CHOLANGIOGRAM (N/A)  Patient Location: PACU  Anesthesia Type:General  Level of Consciousness: awake, alert , oriented and patient cooperative  Airway & Oxygen Therapy: Patient Spontanous Breathing and Patient connected to face mask oxygen  Post-op Assessment: Report given to PACU RN, Post -op Vital signs reviewed and stable and Patient moving all extremities X 4  Post vital signs: stable  Complications: No apparent anesthesia complications

## 2013-06-16 NOTE — Care Management Note (Signed)
    Page 1 of 1   06/16/2013     3:23:59 PM   CARE MANAGEMENT NOTE 06/16/2013  Patient:  Clinton Castillo,Clinton Castillo   Account Number:  [id number]  Date Initiated:  06/16/2013  Documentation initiated by:  Lorenda Ishihara  Subjective/Objective Assessment:   42 yo male admitted s/p lap chole     Action/Plan:   Home when stable   Anticipated DC Date:  06/16/2013   Anticipated DC Plan:  HOME/SELF CARE      DC Planning Services  CM consult      Choice offered to / List presented to:             Status of service:  Completed, signed off Medicare Important Message given?   (If response is "NO", the following Medicare IM given date fields will be blank) Date Medicare IM given:   Date Additional Medicare IM given:    Discharge Disposition:  HOME/SELF CARE  Per UR Regulation:  Reviewed for med. necessity/level of care/duration of stay  If discussed at Long Length of Stay Meetings, dates discussed:    Comments:

## 2013-06-16 NOTE — Op Note (Signed)
06/15/2013 - 06/16/2013  2:58 PM  PATIENT:  Clinton Castillo  42 y.o. male  Patient has no care team.  PRE-OPERATIVE DIAGNOSIS:  cholecystitis  POST-OPERATIVE DIAGNOSIS:  cholecystitis  PROCEDURE:  Procedure(s): LAPAROSCOPIC CHOLECYSTECTOMY WITH INTRAOPERATIVE CHOLANGIOGRAM  SURGEON:  Surgeon(s): Romie Levee, MD  ASSISTANT: none   ANESTHESIA:   general  EBL: 50ml  Total I/O In: 1950 [I.V.:1950] Out: 475 [Urine:475]  DRAINS: none   SPECIMEN:  Source of Specimen:  Gallbladder  DISPOSITION OF SPECIMEN:  PATHOLOGY  COUNTS:  YES  PLAN OF CARE: Admit for overnight observation  PATIENT DISPOSITION:  PACU - hemodynamically stable.  INDICATION: 42 y.o. M with RUQ pain.  US shows thickened GB wall and possible common hep duct stone  The anatomy & physiology of hepatobiliary & pancreatic function was discussed.  The pathophysiology of gallbladder dysfunction was discussed.  Natural history risks without surgery was discussed.   I feel the risks of no intervention will lead to serious problems that outweigh the operative risks; therefore, I recommended cholecystectomy to remove the pathology.  I explained laparoscopic techniques with possible need for an open approach.  Probable cholangiogram to evaluate the bilary tract was explained as well.    Risks such as bleeding, infection, abscess, leak, injury to other organs, need for further treatment, heart attack, death, and other risks were discussed.  I noted a good likelihood this will help address the problem.  Possibility that this will not correct all abdominal symptoms was explained.  Goals of post-operative recovery were discussed as well.    OR FINDINGS: inflamed gallbladder, normal cholangiogram  DESCRIPTION:  The patient was identified & brought into the operating room. The patient was positioned supine with arms tucked. SCDs were active during the entire case. The patient underwent general anesthesia without any difficulty.  The  abdomen was prepped and draped in a sterile fashion. A Surgical Timeout was performed and confirmed our plan.  We positioned the patient in reverse [place] & right side up.  I placed a Hassan laparoscopic port through the umbilicus using open entry technique.  Entry was clean. There were no adhesions to the anterior abdominal wall supraumbilically.  We induced carbon dioxide insufflation. Camera inspection revealed no injury.    I proceeded to continue with laparoscopic technique. I placed a #5 port in mid subcostal region, another 5mm port in the right flank near the anterior axillary line, and a 5mm port in the left subxiphoid region obliquely within the falciform ligament.  I turned attention to the right upper quadrant.   The gallbladder fundus was elevated cephalad. I used cautery and blunt dissection to free the peritoneal coverings between the gallbladder and the liver on the posteriolateral and anteriomedial walls.   I used careful blunt and cautery dissection with a maryland dissector to help get a good critical view of the cystic artery and cystic duct. I did further dissection to free a few centimeters of the  gallbladder off the liver bed to get a good critical view of the infundibulum and cystic duct. I mobilized the cystic artery.  I skeletonized the cystic duct.  After getting a good 360 view, I decided to perform a cholangiogram.  I placed a clip on the infundibulum.   I did a partial cystic duct-otomy and ensured patency. I placed a 5 [place] cholangiocatheter through a puncture site at the right subcostal ridge of the abdominal wall and directed it into the cystic duct.  We ran a cholangiogram with dilute radio-opaque contrast  and continuous fluoroscopy.  Contrast flowed from a side branch consistent with cystic duct cannulization. Contrast flowed up the common hepatic duct into the right and left intrahepatic chains out to secondary radicals. Contrast flowed down the common bile duct  easily across the normal ampulla into the duodenum.  This was consistent with a normal cholangiogram.  I removed the cholangiocatheter.  I placed clips on the cystic duct x3.  I completed cystic duct transection.   I placed clips on the cystic artery x3 with 2 proximally.  I ligated the cystic artery using scissors. I freed the gallbladder from its remaining attachments to the liver. I ensured hemostasis on the gallbladder fossa of the liver and elsewhere. I inspected the rest of the abdomen & detected no injury nor bleeding elsewhere.  I irrigated the RUQ with normal saline.  I removed the gallbladder through the umbilical port site.  I closed the umbilical fascia using 0 Vicryl stitches.   I closed the skin using 4-0 vicryl stitch.  Sterile dressings were applied. The patient was extubated & arrived in the PACU in stable condition.  I had discussed postoperative care with the patient in the holding area.  I will discuss  operative findings and postoperative goals / instructions with the patient's family.  Instructions are written in the chart as well.

## 2013-06-16 NOTE — Progress Notes (Signed)
ATTENDING ADDENDUM:  I personally reviewed patient's record, examined the patient, and formulated the following assessment and plan:  OR today for lap chole and IOC

## 2013-06-16 NOTE — Anesthesia Postprocedure Evaluation (Signed)
Anesthesia Post Note  Patient: Clinton Castillo  Procedure(s) Performed: Procedure(s) (LRB): LAPAROSCOPIC CHOLECYSTECTOMY WITH INTRAOPERATIVE CHOLANGIOGRAM (N/A)  Anesthesia type: General  Patient location: PACU  Post pain: Pain level controlled  Post assessment: Post-op Vital signs reviewed  Last Vitals: BP 132/80  Pulse 60  Temp(Src) 36.8 C (Oral)  Resp 14  Ht 5\' 5"  (1.651 m)  Wt 150 lb 12.7 oz (68.4 kg)  BMI 25.09 kg/m2  SpO2 97%  Post vital signs: Reviewed  Level of consciousness: sedated  Complications: No apparent anesthesia complications

## 2013-06-16 NOTE — Anesthesia Preprocedure Evaluation (Addendum)
Anesthesia Evaluation  Patient identified by MRN, date of birth, ID band Patient awake    Reviewed: Allergy & Precautions, H&P , NPO status , Patient's Chart, lab work & pertinent test results  Airway Mallampati: II TM Distance: >3 FB Neck ROM: Full    Dental  (+) Dental Advisory Given and Teeth Intact   Pulmonary neg pulmonary ROS,  breath sounds clear to auscultation        Cardiovascular negative cardio ROS  Rhythm:Regular Rate:Normal     Neuro/Psych negative neurological ROS  negative psych ROS   GI/Hepatic negative GI ROS, Neg liver ROS,   Endo/Other  negative endocrine ROS  Renal/GU negative Renal ROS     Musculoskeletal negative musculoskeletal ROS (+)   Abdominal   Peds  Hematology negative hematology ROS (+)   Anesthesia Other Findings   Reproductive/Obstetrics negative OB ROS                          Anesthesia Physical Anesthesia Plan  ASA: II  Anesthesia Plan: General   Post-op Pain Management:    Induction: Intravenous  Airway Management Planned: Oral ETT  Additional Equipment:   Intra-op Plan:   Post-operative Plan: Extubation in OR  Informed Consent: I have reviewed the patients History and Physical, chart, labs and discussed the procedure including the risks, benefits and alternatives for the proposed anesthesia with the patient or authorized representative who has indicated his/her understanding and acceptance.   Dental advisory given  Plan Discussed with: CRNA  Anesthesia Plan Comments:         Anesthesia Quick Evaluation

## 2013-06-17 LAB — CBC
MCV: 88.5 fL (ref 78.0–100.0)
Platelets: 208 10*3/uL (ref 150–400)
RDW: 12.3 % (ref 11.5–15.5)
WBC: 12.4 10*3/uL — ABNORMAL HIGH (ref 4.0–10.5)

## 2013-06-17 MED ORDER — OXYCODONE-ACETAMINOPHEN 5-325 MG PO TABS: 1.0000 | ORAL_TABLET | ORAL | Status: DC | PRN

## 2013-06-17 NOTE — Progress Notes (Signed)
1 Day Post-Op  Subjective: He feels well.  Tolerating diet without nausea.  Objective: Vital signs in last 24 hours: Temp:  [97.3 F (36.3 C)-98.4 F (36.9 C)] 98 F (36.7 C) (08/30 0542) Pulse Rate:  [49-97] 96 (08/30 0542) Resp:  [14-18] 18 (08/30 0542) BP: (114-148)/(58-86) 114/58 mmHg (08/30 0542) SpO2:  [93 %-100 %] 98 % (08/30 0542) Last BM Date: 06/14/13  Intake/Output from previous day: 08/29 0701 - 08/30 0700 In: 3750.8 [I.V.:3750.8] Out: 1025 [Urine:1025] Intake/Output this shift:    General appearance: alert, cooperative and no distress Resp: nonlabored Cardio: normal rate, regular GI: soft, minimal periumbilical tenderness, ND, wounds okay, no peritoneal signs  Lab Results:   Recent Labs  06/16/13 0418 06/17/13 0428  WBC 8.8 12.4*  HGB 16.9 16.1  HCT 48.4 45.4  PLT 228 208   BMET  Recent Labs  06/15/13 1607 06/16/13 0418  NA 138 137  K 3.3* 3.7  CL 104 105  CO2 27 26  GLUCOSE 134* 107*  BUN 12 9  CREATININE 0.66 0.74  CALCIUM 9.6 9.2   PT/INR No results found for this basename: LABPROT, INR,  in the last 72 hours ABG No results found for this basename: PHART, PCO2, PO2, HCO3,  in the last 72 hours  Studies/Results: Dg Cholangiogram Operative  06/16/2013   *RADIOLOGY REPORT*  Clinical Data: Cholecystectomy  INTRAOPERATIVE CHOLANGIOGRAM  Technique:  Multiple fluoroscopic spot radiographs were obtained during intraoperative cholangiogram and are submitted for interpretation post-operatively.  Comparison: None.  Findings: No persistent filling defects in the common duct. Intrahepatic ducts are incompletely visualized, appearing decompressed centrally. Contrast passes into the duodenum.  Minimal contrast reflux into the pancreatic duct.  IMPRESSION  Negative for retained common duct stone.   Original Report Authenticated By: D. Andria Rhein, MD   US Abdomen Complete  06/15/2013   *RADIOLOGY REPORT*  Clinical Data:  Right upper quadrant abdominal  pain.  COMPLETE ABDOMINAL ULTRASOUND  Comparison:  Unenhanced CT abdomen and pelvis 12/14/2012.  Findings:  Gallbladder:  Multiple shadowing gallstones, the largest approximating 1.7 cm.  Gallbladder wall thickening up to approximately 5 mm.  No pericholecystic fluid.  Negative sonographic Murphy's sign according to the ultrasound technologist.  Common bile duct:  Normal in caliber with maximum diameter approximating 3 mm.  Possible stone in the common hepatic duct as it enters the liver, as there is an echogenic focus in this location.  Liver:  Normal size and echotexture without focal parenchymal abnormality.  Patent portal vein with hepatopetal flow.  IVC:  Patent in its intrahepatic portion.  Obscured outside the liver by bowel gas.  Pancreas:  Obscured by overlying bowel gas and therefore not evaluated.  Spleen:  Normal size and echotexture without focal parenchymal abnormality.  Right Kidney:  No hydronephrosis.  Well-preserved cortex.  No shadowing calculi.  Normal size and parenchymal echotexture without focal abnormalities.  Approximately 11.2 cm in length.  Left Kidney:  No hydronephrosis.  Well-preserved cortex.  No shadowing calculi.  Normal size and parenchymal echotexture without focal abnormalities.  Approximately 11.8 cm length.  Abdominal aorta:  Normal in caliber proximally, though obscured in its mid and distal portion by overlying bowel gas.  IMPRESSION:  1.  Cholelithiasis.  Gallbladder wall thickening is consistent with cholecystitis. 2.  No biliary ductal dilation, though there may be a stone in the common hepatic duct. 3.  Otherwise normal examination with a caveat that the pancreas, the extrahepatic IVC and the mid and distal abdominal aorta were obscured  by overlying bowel gas and were therefore not evaluated.   Original Report Authenticated By: Hulan Saas, M.D.    Anti-infectives: Anti-infectives   Start     Dose/Rate Route Frequency Ordered Stop   06/15/13 2100  ciprofloxacin  (CIPRO) IVPB 400 mg  Status:  Discontinued     400 mg 200 mL/hr over 60 Minutes Intravenous Every 12 hours 06/15/13 1956 06/16/13 1438      Assessment/Plan: s/p Procedure(s): LAPAROSCOPIC CHOLECYSTECTOMY WITH INTRAOPERATIVE CHOLANGIOGRAM (N/A) he feels well, tolerating diet.  HGB okay, should be okay for discharge to home  LOS: 2 days    Lodema Pilot DAVID 06/17/2013

## 2013-06-17 NOTE — Progress Notes (Signed)
Assessment unchanged. Pt verbalized understanding of dc instructions through teach back. Understands and told nurse when expected to follow up with surgeon in office. Script x 1 given as provided by MD. Dr. Maisie Fus notified prior to dc of pt's concern regarding when to return to work which involves lifting. MD instructed no lifting for 8 weeks and no driving while taking narcotics. Pt verbalized understanding of instruction. Encouraged pt to call office with any other questions or any problems arise. Familiar with My Chart with plans to sign up soon. Discharged via wc to front entrance to meet awaiting vehicle to carry home. Accompanied by NT and friend.

## 2013-06-20 ENCOUNTER — Encounter (HOSPITAL_COMMUNITY): Payer: Self-pay | Admitting: General Surgery

## 2013-06-28 ENCOUNTER — Encounter (INDEPENDENT_AMBULATORY_CARE_PROVIDER_SITE_OTHER): Payer: Self-pay

## 2013-06-30 NOTE — Discharge Summary (Signed)
  Physician Discharge Summary  Patient ID: Clinton Castillo MRN: [id number] DOB/AGE: 1970/12/05 42 y.o.  Admit date: 06/15/2013 Discharge date: 06/30/2013  Admitting Diagnosis: Abdominal pain   Discharge Diagnosis Acute cholecystitis  Consultants None  Procedures Laparoscopic Cholecystectomy with [facility]  Hospital Course 42 yr old male who presented to [facility] with abdominal pain.  Workup showed cholecystitis.  Patient was admitted and underwent procedure listed above.  Tolerated procedure well and was transferred to the floor.  Diet was advanced as tolerated.  On POD#1, the patient was voiding well, tolerating diet, ambulating well, pain well controlled, vital signs stable, incisions c/d/i and felt stable for discharge home.  Patient will follow up in our office in 2 weeks and knows to call with questions or concerns.    Medication List         oxyCODONE-acetaminophen 5-325 MG per tablet  Commonly known as:  PERCOCET/ROXICET  Take 1 tablet by mouth every 4 (four) hours as needed.           Signed: Clance Boll, [facility] [phone]  06/30/2013, 7:06 AM

## 2013-07-03 ENCOUNTER — Encounter (INDEPENDENT_AMBULATORY_CARE_PROVIDER_SITE_OTHER): Payer: Self-pay

## 2013-07-03 ENCOUNTER — Encounter (INDEPENDENT_AMBULATORY_CARE_PROVIDER_SITE_OTHER): Payer: Self-pay | Admitting: General Surgery

## 2013-07-03 ENCOUNTER — Ambulatory Visit (INDEPENDENT_AMBULATORY_CARE_PROVIDER_SITE_OTHER): Payer: Managed Care, Other (non HMO) | Admitting: General Surgery

## 2013-07-03 VITALS — BP 140/80 | HR 68 | Resp 14 | Ht 65.0 in | Wt 156.0 lb

## 2013-07-03 DIAGNOSIS — Z9889 Other specified postprocedural states: Secondary | ICD-10-CM

## 2013-07-03 NOTE — Progress Notes (Signed)
Clinton Castillo is a 42 y.o. male who is status post a lap chole on 8/29.  He is doing well.  He is if off narcotics.  He is tolerating a diet and having BM's.  Objective: Filed Vitals:   07/03/13 1343  BP: 140/80  Pulse: 68  Resp: 14    General appearance: alert and cooperative GI: normal findings: soft, non-tender  Incision: healing well   Assessment: s/p  There are no active problems to display for this patient.   Plan: Doing well.  RTW now on light duty or after OCt 15 on full duty.  F/U PRN    .Vanita Panda, MD [facility], [place] [phone]   07/03/2013 2:09 PM

## 2013-07-03 NOTE — Patient Instructions (Signed)
No heavy lifting for 6 weeks after surgery.

## 2013-07-19 ENCOUNTER — Ambulatory Visit (INDEPENDENT_AMBULATORY_CARE_PROVIDER_SITE_OTHER): Payer: Managed Care, Other (non HMO) | Admitting: General Surgery

## 2014-04-03 ENCOUNTER — Other Ambulatory Visit: Payer: Self-pay | Admitting: Hematology and Oncology

## 2014-04-03 ENCOUNTER — Encounter: Payer: Self-pay | Admitting: Hematology and Oncology

## 2014-04-03 ENCOUNTER — Telehealth: Payer: Self-pay | Admitting: Hematology and Oncology

## 2014-04-03 ENCOUNTER — Ambulatory Visit (HOSPITAL_BASED_OUTPATIENT_CLINIC_OR_DEPARTMENT_OTHER): Payer: BC Managed Care – PPO

## 2014-04-03 ENCOUNTER — Ambulatory Visit (HOSPITAL_BASED_OUTPATIENT_CLINIC_OR_DEPARTMENT_OTHER): Payer: BC Managed Care – PPO | Admitting: Hematology and Oncology

## 2014-04-03 ENCOUNTER — Ambulatory Visit: Payer: BC Managed Care – PPO

## 2014-04-03 DIAGNOSIS — D539 Nutritional anemia, unspecified: Secondary | ICD-10-CM | POA: Insufficient documentation

## 2014-04-03 DIAGNOSIS — D649 Anemia, unspecified: Secondary | ICD-10-CM

## 2014-04-03 DIAGNOSIS — D751 Secondary polycythemia: Secondary | ICD-10-CM

## 2014-04-03 LAB — CBC WITH DIFFERENTIAL/PLATELET
BASO%: 0.8 % (ref 0.0–2.0)
BASOS ABS: 0.1 10*3/uL (ref 0.0–0.1)
EOS ABS: 0.9 10*3/uL — AB (ref 0.0–0.5)
EOS%: 9.8 % — AB (ref 0.0–7.0)
HEMATOCRIT: 33.3 % — AB (ref 38.4–49.9)
HEMOGLOBIN: 11.4 g/dL — AB (ref 13.0–17.1)
LYMPH#: 2.4 10*3/uL (ref 0.9–3.3)
LYMPH%: 26.3 % (ref 14.0–49.0)
MCH: 31.3 pg (ref 27.2–33.4)
MCHC: 34.1 g/dL (ref 32.0–36.0)
MCV: 91.6 fL (ref 79.3–98.0)
MONO#: 0.6 10*3/uL (ref 0.1–0.9)
MONO%: 6.7 % (ref 0.0–14.0)
NEUT%: 56.4 % (ref 39.0–75.0)
NEUTROS ABS: 5.1 10*3/uL (ref 1.5–6.5)
Platelets: 294 10*3/uL (ref 140–400)
RBC: 3.64 10*6/uL — ABNORMAL LOW (ref 4.20–5.82)
RDW: 13.7 % (ref 11.0–14.6)
WBC: 9 10*3/uL (ref 4.0–10.3)

## 2014-04-03 LAB — IRON AND TIBC CHCC
%SAT: 13 % — ABNORMAL LOW (ref 20–55)
IRON: 49 ug/dL (ref 42–163)
TIBC: 371 ug/dL (ref 202–409)
UIBC: 322 ug/dL (ref 117–376)

## 2014-04-03 LAB — RETICULOCYTES
IMMATURE RETIC FRACT: 30.8 % — AB (ref 3.00–10.60)
RBC: 3.65 10*6/uL — ABNORMAL LOW (ref 4.20–5.82)
RETIC CT ABS: 339.09 10*3/uL — AB (ref 34.80–93.90)
Retic %: 9.29 % — ABNORMAL HIGH (ref 0.80–1.80)

## 2014-04-03 LAB — FERRITIN CHCC: Ferritin: 46 ng/ml (ref 22–316)

## 2014-04-03 LAB — LACTATE DEHYDROGENASE (CC13): LDH: 207 U/L (ref 125–245)

## 2014-04-03 NOTE — Progress Notes (Signed)
Checked in new pt with no financial concerns. °

## 2014-04-03 NOTE — Progress Notes (Signed)
[facility] CONSULT NOTE  Patient Care Team: Catha GosselinKevin Little, MD as PCP - General (Family Medicine)  CHIEF COMPLAINTS/PURPOSE OF CONSULTATION:  Erythrocytosis  HISTORY OF PRESENTING ILLNESS:  Clinton Castillo 43 y.o. male is here because of elevated hemoglobin.  He was found to have abnormal CBC from routine blood work. Of note, the patient was fasting for blood work. Historical blood work from 2014 show hemoglobin as high as 18.9. He denies intermittent headaches, shortness of breath on exertion, frequent leg cramps and occasional chest pain.  He never suffer from diagnosis of blood clot.  There is no prior diagnosis of obstructive sleep apnea. The patient denies weight loss or skin itching. The patient is a former smoker but quit many years ago. MEDICAL HISTORY:  Past Medical History  Diagnosis Date  . Medical history non-contributory     SURGICAL HISTORY: Past Surgical History  Procedure Laterality Date  . No past surgeries    . Cholecystectomy N/A 06/16/2013    Procedure: LAPAROSCOPIC CHOLECYSTECTOMY WITH INTRAOPERATIVE CHOLANGIOGRAM;  Surgeon: Romie LeveeAlicia Thomas, MD;  Location: [facility];  Service: General;  Laterality: N/A;    SOCIAL HISTORY: History   Social History  . Marital Status: Married    Spouse Name: N/A    Number of Children: N/A  . Years of Education: N/A   Occupational History  . Not on file.   Social History Main Topics  . Smoking status: Former Smoker    Types: Cigarettes    Quit date: 06/15/2009  . Smokeless tobacco: Never Used  . Alcohol Use: Yes     Comment: "sometimes."  . Drug Use: No  . Sexual Activity: Not on file   Other Topics Concern  . Not on file   Social History Narrative  . No narrative on file    FAMILY HISTORY: History reviewed. No pertinent family history.  ALLERGIES:  has No Known Allergies.  MEDICATIONS:  Current Outpatient Prescriptions  Medication Sig Dispense Refill  . oxyCODONE-acetaminophen (PERCOCET/ROXICET)  5-325 MG per tablet Take 1 tablet by mouth every 4 (four) hours as needed.  40 tablet  0   No current facility-administered medications for this visit.    REVIEW OF SYSTEMS:   Constitutional: Denies fevers, chills or abnormal night sweats Eyes: Denies blurriness of vision, double vision or watery eyes Ears, nose, mouth, throat, and face: Denies mucositis or sore throat Respiratory: Denies cough, dyspnea or wheezes Cardiovascular: Denies palpitation, chest discomfort or lower extremity swelling Gastrointestinal:  Denies nausea, heartburn or change in bowel habits Skin: Denies abnormal skin rashes Lymphatics: Denies new lymphadenopathy or easy bruising Neurological:Denies numbness, tingling or new weaknesses Behavioral/Psych: Mood is stable, no new changes  All other systems were reviewed with the patient and are negative.  PHYSICAL EXAMINATION: ECOG PERFORMANCE STATUS: 0 - Asymptomatic GENERAL:alert, no distress and comfortable SKIN: skin color, texture, turgor are normal, no rashes or significant lesions EYES: normal, conjunctiva are pink and non-injected, sclera clear OROPHARYNX:no exudate, no erythema and lips, buccal mucosa, and tongue normal  NECK: supple, thyroid normal size, non-tender, without nodularity LYMPH:  no palpable lymphadenopathy in the cervical, axillary or inguinal LUNGS: clear to auscultation and percussion with normal breathing effort HEART: regular rate & rhythm and no murmurs and no lower extremity edema ABDOMEN:abdomen soft, non-tender and normal bowel sounds Musculoskeletal:no cyanosis of digits and no clubbing  PSYCH: alert & oriented x 3 with fluent speech NEURO: no focal motor/sensory deficits  LABORATORY DATA:  I have reviewed the data as listed Recent  Results (from the past 2160 hour(s))  CBC WITH DIFFERENTIAL     Status: Abnormal   Collection Time    04/03/14 10:29 AM      Result Value Ref Range   WBC 9.0  4.0 - 10.3 10e3/uL   NEUT# 5.1  1.5 -  6.5 10e3/uL   HGB 11.4 (*) 13.0 - 17.1 g/dL   HCT 16.133.3 (*) 09.638.4 - 04.549.9 %   Platelets 294  140 - 400 10e3/uL   MCV 91.6  79.3 - 98.0 fL   MCH 31.3  27.2 - 33.4 pg   MCHC 34.1  32.0 - 36.0 g/dL   RBC 4.093.64 (*) 8.114.20 - 9.145.82 10e6/uL   RDW 13.7  11.0 - 14.6 %   lymph# 2.4  0.9 - 3.3 10e3/uL   MONO# 0.6  0.1 - 0.9 10e3/uL   Eosinophils Absolute 0.9 (*) 0.0 - 0.5 10e3/uL   Basophils Absolute 0.1  0.0 - 0.1 10e3/uL   NEUT% 56.4  39.0 - 75.0 %   LYMPH% 26.3  14.0 - 49.0 %   MONO% 6.7  0.0 - 14.0 %   EOS% 9.8 (*) 0.0 - 7.0 %   BASO% 0.8  0.0 - 2.0 %  LACTATE DEHYDROGENASE (CC13)     Status: None   Collection Time    04/03/14 10:29 AM      Result Value Ref Range   LDH 207  125 - 245 U/L  RETICULOCYTES     Status: Abnormal   Collection Time    04/03/14 11:06 AM      Result Value Ref Range   RBC 3.65 (*) 4.20 - 5.82 10e6/uL   Retic % 9.29 (*) 0.80 - 1.80 %   Retic Ct Abs 339.09 (*) 34.80 - 93.90 10e3/uL   Immature Retic Fract 30.80 (*) 3.00 - 10.60 %  FERRITIN [facility]     Status: None   Collection Time    04/03/14 11:06 AM      Result Value Ref Range   Ferritin 46  22 - 316 ng/ml  IRON AND TIBC [facility]     Status: Abnormal   Collection Time    04/03/14 11:06 AM      Result Value Ref Range   Iron 49  42 - 163 ug/dL   TIBC 782371  956202 - 213409 ug/dL   UIBC 086322  578117 - 469376 ug/dL   %SAT 13 (*) 20 - 55 %    ASSESSMENT & PLAN 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 The patient was originally found to have erythrocytosis but stat blood work from the office came back and showed that he is anemic. I will cancel peripheral blood for JAK2 mutation and order an additional workup for anemia.

## 2014-04-03 NOTE — Telephone Encounter (Signed)
m, °

## 2014-04-03 NOTE — Assessment & Plan Note (Signed)
This is peculiar. The patient was originally found to have erythrocytosis but stat blood work from the office came back and showed that he is anemic. I will cancel peripheral blood for JAK2 mutation and order an additional workup for anemia.

## 2014-04-04 LAB — VITAMIN B12: VITAMIN B 12: 478 pg/mL (ref 211–911)

## 2014-04-05 ENCOUNTER — Ambulatory Visit: Payer: BC Managed Care – PPO

## 2014-04-05 LAB — ERYTHROPOIETIN: ERYTHROPOIETIN: 68.5 m[IU]/mL — AB (ref 2.6–18.5)

## 2014-04-09 ENCOUNTER — Ambulatory Visit: Payer: Managed Care, Other (non HMO) | Admitting: Hematology and Oncology

## 2014-04-09 ENCOUNTER — Ambulatory Visit: Payer: Managed Care, Other (non HMO)

## 2014-04-18 ENCOUNTER — Ambulatory Visit: Payer: BC Managed Care – PPO | Admitting: Hematology and Oncology

## 2015-01-03 ENCOUNTER — Ambulatory Visit (INDEPENDENT_AMBULATORY_CARE_PROVIDER_SITE_OTHER): Payer: BLUE CROSS/BLUE SHIELD | Admitting: Family Medicine

## 2015-01-03 ENCOUNTER — Ambulatory Visit (INDEPENDENT_AMBULATORY_CARE_PROVIDER_SITE_OTHER): Payer: BLUE CROSS/BLUE SHIELD

## 2015-01-03 VITALS — BP 130/80 | HR 61 | Temp 97.5°F | Resp 18 | Ht 65.0 in | Wt 159.6 lb

## 2015-01-03 DIAGNOSIS — R1013 Epigastric pain: Secondary | ICD-10-CM | POA: Diagnosis not present

## 2015-01-03 DIAGNOSIS — D751 Secondary polycythemia: Secondary | ICD-10-CM | POA: Diagnosis not present

## 2015-01-03 DIAGNOSIS — B9681 Helicobacter pylori [H. pylori] as the cause of diseases classified elsewhere: Secondary | ICD-10-CM

## 2015-01-03 DIAGNOSIS — K279 Peptic ulcer, site unspecified, unspecified as acute or chronic, without hemorrhage or perforation: Secondary | ICD-10-CM

## 2015-01-03 DIAGNOSIS — K2970 Gastritis, unspecified, without bleeding: Secondary | ICD-10-CM

## 2015-01-03 LAB — COMPREHENSIVE METABOLIC PANEL
ALBUMIN: 4.8 g/dL (ref 3.5–5.2)
ALK PHOS: 39 U/L (ref 39–117)
ALT: 29 U/L (ref 0–53)
AST: 24 U/L (ref 0–37)
BUN: 12 mg/dL (ref 6–23)
CHLORIDE: 103 meq/L (ref 96–112)
CO2: 29 meq/L (ref 19–32)
Calcium: 10.1 mg/dL (ref 8.4–10.5)
Creat: 0.89 mg/dL (ref 0.50–1.35)
GLUCOSE: 85 mg/dL (ref 70–99)
POTASSIUM: 4.2 meq/L (ref 3.5–5.3)
SODIUM: 140 meq/L (ref 135–145)
Total Bilirubin: 0.8 mg/dL (ref 0.2–1.2)
Total Protein: 7.5 g/dL (ref 6.0–8.3)

## 2015-01-03 LAB — POCT CBC
GRANULOCYTE PERCENT: 61.2 % (ref 37–80)
HCT, POC: 57 % — AB (ref 43.5–53.7)
HEMOGLOBIN: 18.5 g/dL — AB (ref 14.1–18.1)
LYMPH, POC: 3.1 (ref 0.6–3.4)
MCH, POC: 29.9 pg (ref 27–31.2)
MCHC: 32.5 g/dL (ref 31.8–35.4)
MCV: 92.1 fL (ref 80–97)
MID (cbc): 0.9 (ref 0–0.9)
MPV: 8.2 fL (ref 0–99.8)
PLATELET COUNT, POC: 212 10*3/uL (ref 142–424)
POC GRANULOCYTE: 6.2 (ref 2–6.9)
POC LYMPH PERCENT: 30.3 %L (ref 10–50)
POC MID %: 8.5 %M (ref 0–12)
RBC: 6.19 M/uL — AB (ref 4.69–6.13)
RDW, POC: 13.9 %
WBC: 10.1 10*3/uL (ref 4.6–10.2)

## 2015-01-03 LAB — LIPASE: LIPASE: 40 U/L (ref 0–75)

## 2015-01-03 MED ORDER — SUCRALFATE 1 G PO TABS: 1.0000 g | ORAL_TABLET | Freq: Three times a day (TID) | ORAL | Status: DC

## 2015-01-03 MED ORDER — PANTOPRAZOLE SODIUM 40 MG PO TBEC: 40.0000 mg | DELAYED_RELEASE_TABLET | Freq: Two times a day (BID) | ORAL | Status: DC

## 2015-01-03 NOTE — Progress Notes (Addendum)
Subjective:  This chart was scribed for Clinton SorensonEva Shaw, MD by Clinton Castillo, Medial Scribe. This patient was seen in room 2 and the patient's care was started at 9:40 AM.    Patient ID: Clinton Castillo, male    DOB: 09/16/1971, 44 y.o.   MRN: [id number] Chief Complaint  Patient presents with  . Abdominal Pain    upper abdomen pain x 2 weeks   . Gastrophageal Reflux    x 2-3 weeks    HPI HPI Comments: Clinton Castillo is a 44 y.o. male who presents to the [facility] complaining of abdominal pain for two weeks and heartburn for 2-3 weeks now. Patient with history of cholescystectomy 1.5 years ago. Has history of anemia for which he sis have hematology evaluation last year. The hemal evaluation was because patient has marked polycythemia  followed by anemia both of unknown etiology. Patient has distant history of smoking. Does not appear that he had any etiology found oat the time of ys his erythropoietin was markedly increased. Had CT of abdomen 2 years which shoed history of kidney stones and gall stones.  Patient reports epigastric pain for 2-3 weeks. Patient has not taken any OTC medications. Patient reports pain with working or activity at night and when hungry. Patient reports alleviation of this pain at rest and with eating. Patient denies complications with eating spicy food, but reports heartburn with eating greasy foods ie pizza. Patient reports normal bowel and bladder activity and states that he is eating normal. Patient does report increased burping and gas. Patient reports taking a OTC medication for his heartburn once or twice. Patient denies melena. Patient denies history of colon cancer. Patient denies endoscopy or prostate exams.   Patient Active Problem List   Diagnosis Date Noted  . Unspecified deficiency anemia 04/03/2014   Past Medical History  Diagnosis Date  . Medical history non-contributory    Past Surgical History  Procedure Laterality Date  . No past  surgeries    . Cholecystectomy N/A 06/16/2013    Procedure: LAPAROSCOPIC CHOLECYSTECTOMY WITH INTRAOPERATIVE CHOLANGIOGRAM;  Surgeon: Clinton LeveeAlicia Thomas, MD;  Location: [facility];  Service: General;  Laterality: N/A;  . Gall bladder removed  2014   No Known Allergies Prior to Admission medications   Medication Sig Start Date End Date Taking? Authorizing Provider  aspirin 81 MG tablet Take 81 mg by mouth daily.   Yes Historical Provider, MD   History   Social History  . Marital Status: Married    Spouse Name: N/A  . Number of Children: N/A  . Years of Education: N/A   Occupational History  . Not on file.   Social History Main Topics  . Smoking status: Current Some Day Smoker    Types: Cigarettes    Last Attempt to Quit: 06/15/2009  . Smokeless tobacco: Never Used  . Alcohol Use: 0.0 oz/week    0 Standard drinks or equivalent per week     Comment: "sometimes."  . Drug Use: No  . Sexual Activity: Not on file   Other Topics Concern  . Not on file   Social History Narrative    Review of Systems  Constitutional: Negative for fever and chills.  Cardiovascular: Positive for chest pain.  Gastrointestinal: Positive for abdominal pain. Negative for vomiting, diarrhea, constipation and blood in stool.       Objective:   Physical Exam  Constitutional: He is oriented to person, place, and time. He appears well-developed and well-nourished. No distress.  HENT:  Head: Normocephalic and atraumatic.  Eyes: EOM are normal.  Neck: Neck supple. No tracheal deviation present.  Cardiovascular: Normal rate, regular rhythm, S1 normal, S2 normal and normal heart sounds.  Exam reveals no gallop.   No murmur heard. Pulmonary/Chest: Effort normal and breath sounds normal. No respiratory distress.  Bowel sounds heard in chest. Good air movement. Lungs clear to auscultation.   Abdominal: He exhibits no distension. There is no hepatosplenomegaly. There is no tenderness. There is no CVA tenderness.    Hyperactive tympanic bowel sounds. Soft, nontender, no hepatosplenomegaly.   Musculoskeletal: Normal range of motion.  Neurological: He is alert and oriented to person, place, and time.  Skin: Skin is warm and dry.  Psychiatric: He has a normal mood and affect. His behavior is normal.  Nursing note and vitals reviewed.   Filed Vitals:   01/03/15 0902  BP: 130/80  Pulse: 61  Temp: 97.5 F (36.4 C)  TempSrc: Oral  Resp: 18  Height:  (1.651 m)  Weight: 159 lb 9.6 oz (72.394 kg)  SpO2: 99%   [facility] reading (PRIMARY) by  Clinton Castillo: acute abdomen series, no definitive hiatal hernia seen, poor inhalation on chest Xray but no acute abnormality. Question of a possible slight right upper lobe nodule near hilum but would will ask radiologist. Copious amount of stool.   Results for orders placed or performed in visit on 01/03/15  POCT CBC  Result Value Ref Range   WBC 10.1 4.6 - 10.2 K/uL   Lymph, poc 3.1 0.6 - 3.4   POC LYMPH PERCENT 30.3 10 - 50 %L   MID (cbc) 0.9 0 - 0.9   POC MID % 8.5 0 - 12 %M   POC Granulocyte 6.2 2 - 6.9   Granulocyte percent 61.2 37 - 80 %G   RBC 6.19 (A) 4.69 - 6.13 M/uL   Hemoglobin 18.5 (A) 14.1 - 18.1 g/dL   HCT, POC 16.1 (A) 09.6 - 53.7 %   MCV 92.1 80 - 97 fL   MCH, POC 29.9 27 - 31.2 pg   MCHC 32.5 31.8 - 35.4 g/dL   RDW, POC 04.5 %   Platelet Count, POC 212 142 - 424 K/uL   MPV 8.2 0 - 99.8 fL         Assessment & Plan:   Abdominal pain, epigastric - Plan: POCT CBC, Comprehensive metabolic panel, Lipase, H. pylori breath test, DG Abd Acute W/Chest  Peptic ulcer disease - Plan: H. pylori breath test  Polycythemia  Meds ordered this encounter  Medications  . aspirin 81 MG tablet    Sig: Take 81 mg by mouth daily.  Marland Kitchen DISCONTD: pantoprazole (PROTONIX) 40 MG tablet    Sig: Take 1 tablet (40 mg total) by mouth 2 (two) times daily before a meal.    Dispense:  180 tablet    Refill:  1  . DISCONTD: sucralfate (CARAFATE) 1 G tablet     Sig: Take 1 tablet (1 g total) by mouth 4 (four) times daily -  with meals and at bedtime.    Dispense:  60 tablet    Refill:  1  . pantoprazole (PROTONIX) 40 MG tablet    Sig: Take 1 tablet (40 mg total) by mouth 2 (two) times daily before a meal.    Dispense:  180 tablet    Refill:  1  . sucralfate (CARAFATE) 1 G tablet    Sig: Take 1 tablet (1 g total) by mouth 4 (  four) times daily -  with meals and at bedtime.    Dispense:  60 tablet    Refill:  1    I personally performed the services described in this documentation, which was scribed in my presence. The recorded information has been reviewed and considered, and addended by me as needed.  Clinton Sorenson, MD MPH

## 2015-01-03 NOTE — Patient Instructions (Signed)
Food Choices for Peptic Ulcer Disease When you have peptic ulcer disease, the foods you eat and your eating habits are very important. Choosing the right foods can help ease the discomfort of peptic ulcer disease. WHAT GENERAL GUIDELINES DO I NEED TO FOLLOW?  Choose fruits, vegetables, whole grains, and low-fat meat, fish, and poultry.   Keep a food diary to identify foods that cause symptoms.  Avoid foods that cause irritation or pain. These may be different for different people.  Eat frequent small meals instead of three large meals each day. The pain may be worse when your stomach is empty.  Avoid eating close to bedtime. WHAT FOODS ARE NOT RECOMMENDED? The following are some foods and drinks that may worsen your symptoms:  Black, white, and red pepper.  Hot sauce.  Chili peppers.  Chili powder.  Chocolate and cocoa.   Alcohol.  Tea, coffee, and cola (regular and decaffeinated). The items listed above may not be a complete list of foods and beverages to avoid. Contact your dietitian for more information. Document Released: 12/28/2011 Document Revised: 10/10/2013 Document Reviewed: 08/09/2013 [facility] Patient Information 2015 [place], [place]. This information is not intended to replace advice given to you by your health care provider. Make sure you discuss any questions you have with your health care provider.   Peptic Ulcer A peptic ulcer is a sore in the lining of your esophagus (esophageal ulcer), stomach (gastric ulcer), or in the first part of your small intestine (duodenal ulcer). The ulcer causes erosion into the deeper tissue. CAUSES  Normally, the lining of the stomach and the small intestine protects itself from the acid that digests food. The protective lining can be damaged by:  An infection caused by a bacterium called Helicobacter pylori (H. pylori).  Regular use of nonsteroidal anti-inflammatory drugs (NSAIDs), such as ibuprofen or aspirin.  Smoking  tobacco. Other risk factors include being older than 50, drinking alcohol excessively, and having a family history of ulcer disease.  SYMPTOMS   Burning pain or gnawing in the area between the chest and the belly button.  Heartburn.  Nausea and vomiting.  Bloating. The pain can be worse on an empty stomach and at night. If the ulcer results in bleeding, it can cause:  Black, tarry stools.  Vomiting of bright red blood.  Vomiting of coffee-ground-looking materials. DIAGNOSIS  A diagnosis is usually made based upon your history and an exam. Other tests and procedures may be performed to find the cause of the ulcer. Finding a cause will help determine the best treatment. Tests and procedures may include:  Blood tests, stool tests, or breath tests to check for the bacterium H. pylori.  An upper gastrointestinal (GI) series of the esophagus, stomach, and small intestine.  An endoscopy to examine the esophagus, stomach, and small intestine.  A biopsy. TREATMENT  Treatment may include:  Eliminating the cause of the ulcer, such as smoking, NSAIDs, or alcohol.  Medicines to reduce the amount of acid in your digestive tract.  Antibiotic medicines if the ulcer is caused by the H. pylori bacterium.  An upper endoscopy to treat a bleeding ulcer.  Surgery if the bleeding is severe or if the ulcer created a hole somewhere in the digestive system. HOME CARE INSTRUCTIONS   Avoid tobacco, alcohol, and caffeine. Smoking can increase the acid in the stomach, and continued smoking will impair the healing of ulcers.  Avoid foods and drinks that seem to cause discomfort or aggravate your ulcer.  Only take  medicines as directed by your caregiver. Do not substitute over-the-counter medicines for prescription medicines without talking to your caregiver.  Keep any follow-up appointments and tests as directed. SEEK MEDICAL CARE IF:   Your do not improve within 7 days of starting  treatment.  You have ongoing indigestion or heartburn. SEEK IMMEDIATE MEDICAL CARE IF:   You have sudden, sharp, or persistent abdominal pain.  You have bloody or dark black, tarry stools.  You vomit blood or vomit that looks like coffee grounds.  You become light-headed, weak, or feel faint.  You become sweaty or clammy. MAKE SURE YOU:   Understand these instructions.  Will watch your condition.  Will get help right away if you are not doing well or get worse. Document Released: 10/02/2000 Document Revised: 02/19/2014 Document Reviewed: 05/04/2012 [facility] Patient Information 2015 [place], [place]. This information is not intended to replace advice given to you by your health care provider. Make sure you discuss any questions you have with your health care provider.   B?nh Lot D? Dy (Peptic Ulcer Disease) B?nh Lot D? Dy l hi?n t??ng lot ? nim m?c c?a th?c qu?n (lot th?c qu?n), d? dy (lot d? dy) ho?c trong ph?n ??u tin c?a ru?t non (lot t trng). Lot ?n mn vo cc m su h?n.  NGUYN NHN Thng th??ng, nim m?c d? dy v ru?t non t? b?o v? chnh n kh?i b? axit tiu ha th?c ?n ?n mn. Nim m?c b?o v? ny c th? b? t?n h?i do:  Nhi?m trng gy b?i m?t lo?i vi khu?n c tn Helicobacter pylori (H. pylori).  Th??ng xuyn s? d?ng thu?c khng vim khng c steroid (NSAID) nh? ibuprofen ho?c atpirin.  Ht thu?c l. Cc y?u t? nguy c? khc bao g?m: trn 50 tu?i, u?ng r??u qu m?c v c ti?n s? gia ?nh b? b?nh lot. TRI?U CH?NG  ?au rt ho?c g?m nh?m ? khu v?c gi?a ng?c v r?n.  ? nng.  Bu?n nn v nn m?a.  ??y h?i. C?n ?au c th? t?i t? h?n khi b?ng ?i v vo ban ?m. N?u ch? lot gy ch?y mu, n c th? gy ra:  Phn ?en, c d?ng h?c n.  Nn ra mu mu ?? t??i.  Nn m?a ra ch?t trng nh? b?t c ph. CH?N ?ON Ch?n ?on th??ng ???c th?c hi?n d?a trn ti?n s? c?a b?n v khm. Cc xt nghi?m v th? thu?t khc c th? ???c th?c hi?n ?? tm ra nguyn nhn  lot. Vi?c tm ki?m nguyn nhn s? gip xc ??nh cch ?i?u tr? t?t nh?t. Cc xt nghi?m v th? thu?t c th? bao g?m:  Xt nghi?m mu, xt nghi?m phn ho?c xt nghi?m h?i th? ?? ki?m tra vi khu?n H. pylori.  H? th?ng ???ng tiu ha trn (GI) g?m th?c qu?n, d? dy v ru?t non.  N?i soi ?? ki?m tra th?c qu?n, d? dy v ru?t non.  Sinh thi?t. ?I?U TR? ?i?u tr? c th? bao g?m:  Lo?i b? nguyn nhn gy lot, ch?ng h?n nh? ht thu?c, NSAID ho?c r??u.  Thu?c ?? gi?m l??ng axit trong ???ng tiu ha.  Thu?c khng sinh n?u v?t lot l do vi khu?n H. pylori.  N?i soi ph?n trn ?? ?i?u tr? v?t lot ch?y mu.  Ph?u thu?t n?u ch?y mu n?ng ho?c n?u v?t lot t?o ra l? ? ?u ? trong h? tiu ha. H??NG D?N CH?M [place] T?I NH  Trnh thu?c l, r??u v caffeine. Ht thu?c c  th? lm t?ng axit trong d? dy v ti?p t?c ht thu?c s? lm gi?m s? ph?c h?i v?t lot.  Trnh th?c ?n v ?? u?ng c v? gy kh ch?u ho?c lm v?t lot n?ng thm.  Ch? s? d?ng thu?c theo ch? d?n c?a chuyn gia ch?m [place] s?c kh?e. Khng thay th? thu?c khng c?n k toa cho thu?c c?n k toa m khng ni chuy?n v?i chuyn gia ch?m [place] s?c kh?e.  Tun th? m?i cu?c h?n khm l?i v xt nghi?m, theo ch? d?n. HY ?I KHM N?U:  Tnh tr?ng c?a b?n khng c?i thi?n trong vng 7 ngy sau khi b?t ??u ?i?u tr?Marland Kitchen.  B?n b? ch?ng kh tiu ho?c ? nng lin t?c. HY NGAY L?P T?C ?I KHM N?U:  B?n b? ?au b?ng ??t ng?t, ?au nhi ho?c ?au dai d?ng.  Phn c?a b?n c mu ho?c c mu ?en s?m nh? h?c n.  B?n nn ra mu ho?c ch?t gi?ng nh? b?t c ph.  B?n b? ?au ??u, y?u ho?c c?m th?y nh? mu?n ng?t.  B?n b? v m? hi ho?c l?nh. ??M B?O B?N:  Hi?u cc h??ng d?n ny.  S? theo di tnh tr?ng c?a mnh.  S? yu c?u tr? gip ngay l?p t?c n?u b?n c?m th?y khng ?? ho?c tnh tr?ng tr?m tr?ng h?n. Document Released: 10/05/2005 Document Revised: 06/07/2013 [facility] Patient Information 2015 [place]. This information is not intended to replace  advice given to you by your health care provider. Make sure you discuss any questions you have with your health care provider.

## 2015-01-04 LAB — H. PYLORI BREATH TEST: H. PYLORI BREATH TEST: DETECTED — AB

## 2015-02-08 ENCOUNTER — Encounter: Payer: Self-pay | Admitting: Family Medicine

## 2015-02-09 MED ORDER — AMOXICILLIN 500 MG PO CAPS: 1000.0000 mg | ORAL_CAPSULE | Freq: Two times a day (BID) | ORAL | Status: DC

## 2015-02-09 MED ORDER — CLARITHROMYCIN 500 MG PO TABS: 500.0000 mg | ORAL_TABLET | Freq: Two times a day (BID) | ORAL | Status: DC

## 2015-02-09 NOTE — Addendum Note (Signed)
Addended by: Norberto SorensonSHAW, Iolani Twilley on: 02/09/2015 12:16 AM   Modules accepted: Orders

## 2015-02-14 ENCOUNTER — Other Ambulatory Visit: Payer: Self-pay | Admitting: Family Medicine

## 2015-03-27 ENCOUNTER — Other Ambulatory Visit: Payer: Self-pay | Admitting: Physician Assistant

## 2015-03-29 NOTE — Telephone Encounter (Signed)
Dr Clelia Croft, you gave pt RFs on protonix for 6 mos, but carafate only 2 RFs. Do you want to give more RFs or pt need to RTC? Also quantity for carafate was only #60, so I changed to #120 for an entire month, but not sure what your intention was?

## 2015-04-03 NOTE — Telephone Encounter (Signed)
Fine to refill but please encourage pt to come back in or f/u w/ his PCP or GI - if he is still needing this medicine, there is likely something more effective that can be done to treat him - usually people only need this for a short time while the stronger meds are kicking in.

## 2015-04-09 NOTE — Telephone Encounter (Signed)
Called and left detailed message as advised by Dr Clelia Croft.

## 2015-07-08 ENCOUNTER — Other Ambulatory Visit: Payer: Self-pay | Admitting: Family Medicine

## 2015-09-18 ENCOUNTER — Other Ambulatory Visit: Payer: Self-pay

## 2015-09-18 MED ORDER — SUCRALFATE 1 G PO TABS: 1.0000 g | ORAL_TABLET | Freq: Four times a day (QID) | ORAL | Status: DC

## 2015-10-12 ENCOUNTER — Other Ambulatory Visit: Payer: Self-pay | Admitting: Family Medicine

## 2015-10-13 ENCOUNTER — Other Ambulatory Visit: Payer: Self-pay | Admitting: Family Medicine

## 2015-11-23 ENCOUNTER — Other Ambulatory Visit: Payer: Self-pay | Admitting: Physician Assistant

## 2015-12-25 ENCOUNTER — Other Ambulatory Visit: Payer: Self-pay | Admitting: Physician Assistant

## 2016-01-15 ENCOUNTER — Other Ambulatory Visit: Payer: Self-pay | Admitting: Physician Assistant

## 2016-06-19 IMAGING — CR DG ABDOMEN ACUTE W/ 1V CHEST
3 series · 3 of 3 positions shown · non-contrast
Comparison: CT Abdomen and Pelvis 10/13/2013.

CLINICAL DATA: 43-year-old male with epigastric abdominal pain and
distension. Query hiatal hernia. Initial encounter.

EXAM:
ACUTE ABDOMEN SERIES (ABDOMEN 2 VIEW & CHEST 1 VIEW)

[PA]
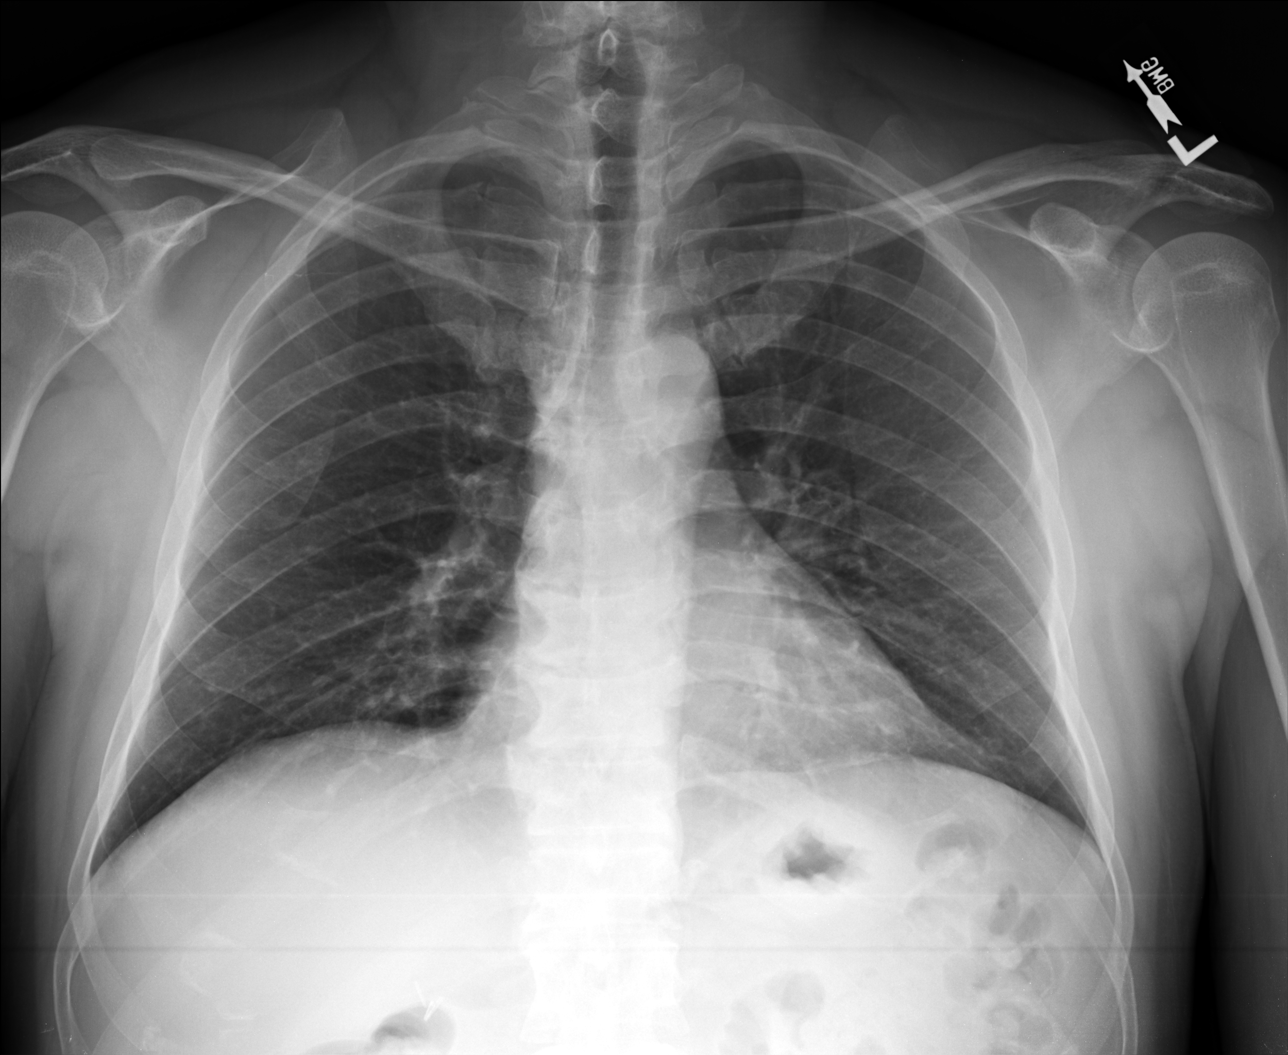

[AP (1 of 2)]
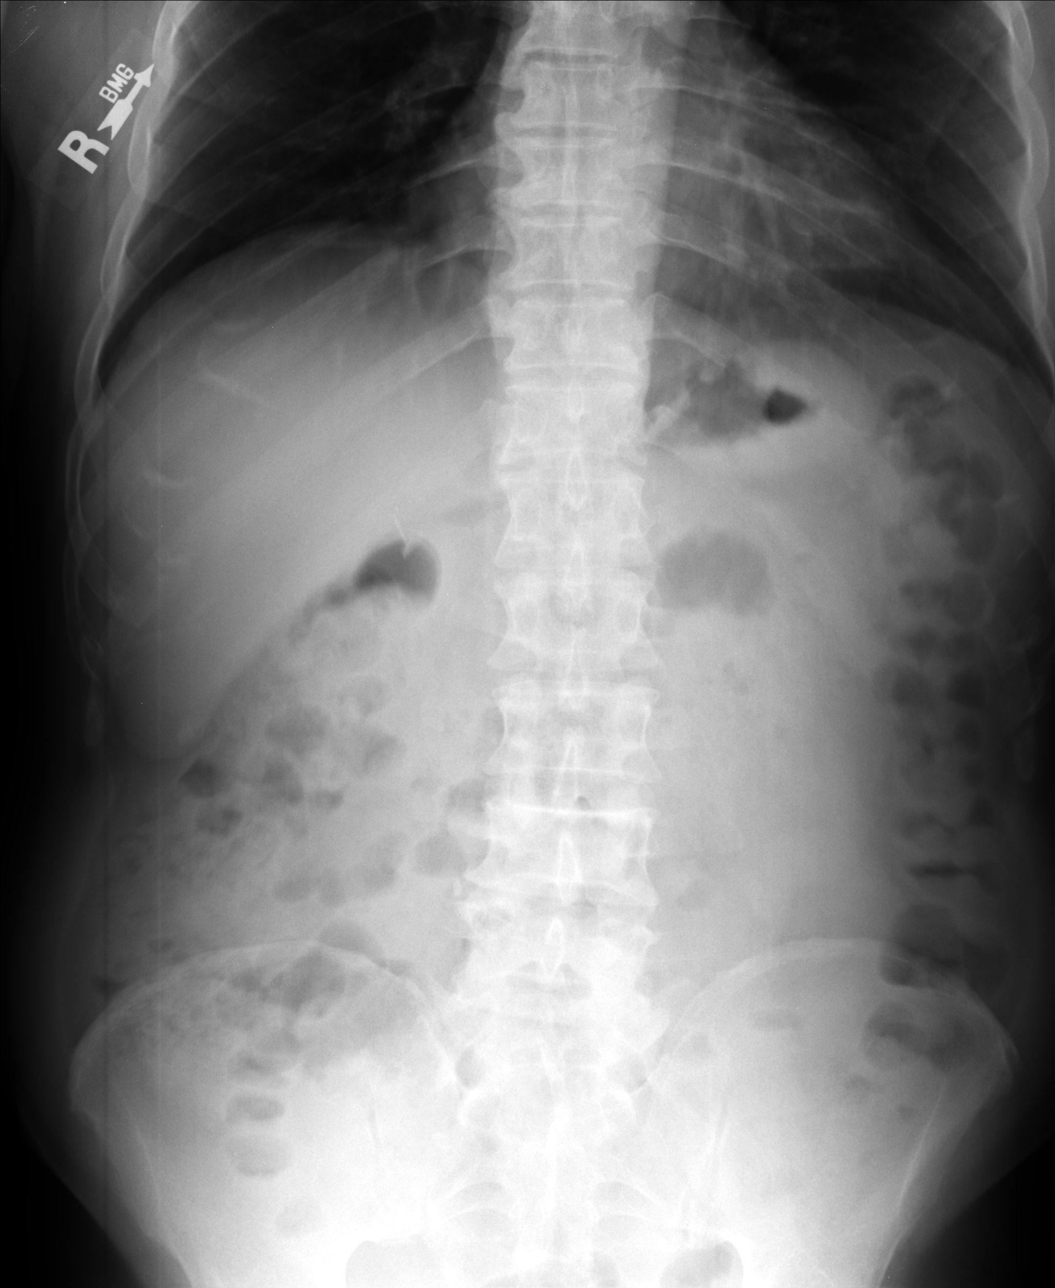

[AP (2 of 2)]
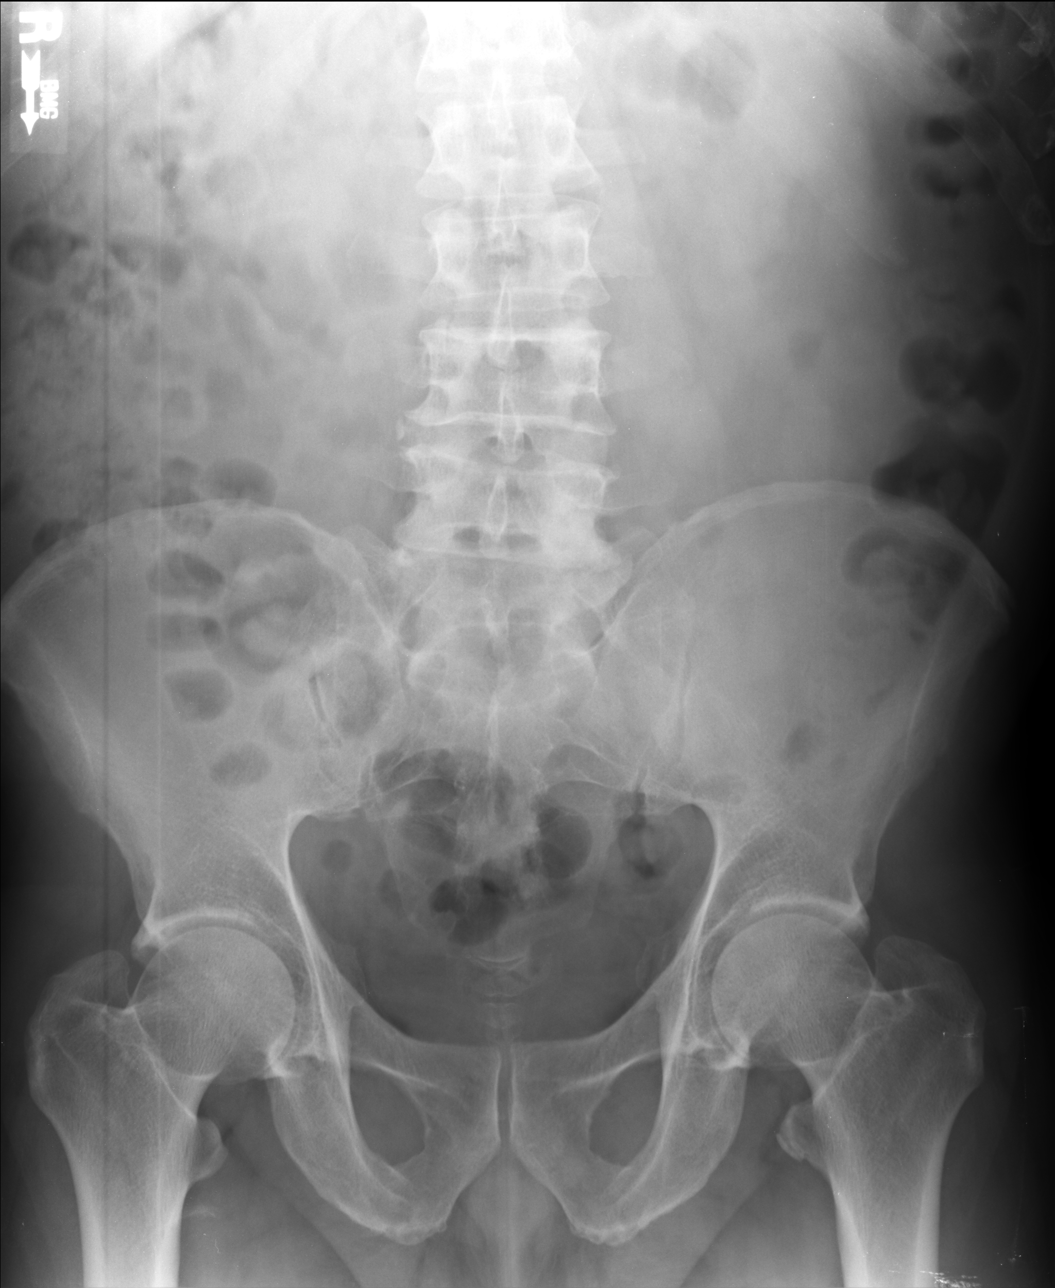

[3 of 3 positions shown; findings below may reference images not displayed]

FINDINGS: Low normal lung volumes. Normal cardiac size and mediastinal
contours. No increased lower mediastinal density to suggest hiatal
hernia.

Visualized tracheal air column is within normal limits. No
pneumothorax or pneumoperitoneum. No pulmonary edema, pleural
effusion or confluent pulmonary opacity. Right hilum appears within
normal limits.

Cholecystectomy clips. Non obstructed bowel gas pattern. Mild to
moderate volume of retained stool in the colon. Abdominal and pelvic
visceral contours are within normal limits. No osseous abnormality
identified.
IMPRESSION: 1.  Normal bowel gas pattern, no free air.
2. No evidence of hiatal hernia. Negative radiographic appearance of
the chest.
3. Previous cholecystectomy.

## 2017-05-15 ENCOUNTER — Encounter: Payer: Self-pay | Admitting: Physician Assistant

## 2017-05-15 ENCOUNTER — Ambulatory Visit (INDEPENDENT_AMBULATORY_CARE_PROVIDER_SITE_OTHER): Payer: BLUE CROSS/BLUE SHIELD | Admitting: Physician Assistant

## 2017-05-15 VITALS — BP 149/83 | HR 75 | Temp 98.2°F | Resp 18 | Ht 65.0 in | Wt 166.0 lb

## 2017-05-15 DIAGNOSIS — L299 Pruritus, unspecified: Secondary | ICD-10-CM

## 2017-05-15 DIAGNOSIS — M79672 Pain in left foot: Secondary | ICD-10-CM | POA: Diagnosis not present

## 2017-05-15 NOTE — Progress Notes (Signed)
     Patient ID: Clinton Castillo, male    DOB: 09/14/1971, 46 y.o.   MRN: [id number]  PCP: Catha GosselinLittle, Kevin, MD  Chief Complaint  Patient presents with  . Itchy ears    x3-4 months, per pt states no pressure just itchy on both sides.    Subjective:   Presents for evaluation of itchy ears x 3-4 months. He is accompanied by his two adolescent children.  Uses Q-tips regularly to clean and "sratch" his ears. No pain, fullness, popping, reduced hearing. No nasal congestion, sneezing, sore throat. No other itching.   Review of Systems As above.    Patient Active Problem List   Diagnosis Date Noted  . 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 Authorizing Provider  aspirin 81 MG tablet Take 81 mg by mouth daily.   Yes [provider]     No Known Allergies     Objective:  Physical Exam  Constitutional: He is oriented to person, place, and time. He appears well-developed and well-nourished. He is active and cooperative. No distress.  BP (!) 149/83 (BP Location: Right Arm, Patient Position: Sitting, Cuff Size: Normal)   Pulse 75   Temp 98.2 F (36.8 C) (Oral)   Resp 18   Ht 5\' 5"  (1.651 m)   Wt 166 lb (75.3 kg)   SpO2 97%   BMI 27.62 kg/m    HENT:  Head: Normocephalic and atraumatic.  Right Ear: Hearing, tympanic membrane, external ear and ear canal normal.  Left Ear: Hearing, tympanic membrane, external ear and ear canal normal.  Nose: Nose normal.  Mouth/Throat: Uvula is midline, oropharynx is clear and moist and mucous membranes are normal. Normal dentition.  No cerumen in either ear canal.  Eyes: Conjunctivae are normal.  Neck: Normal range of motion, full passive range of motion without pain and phonation normal. Neck supple.  Pulmonary/Chest: Effort normal.  Neurological: He is alert and oriented to person, place, and time.  Psychiatric: He has a normal mood and affect. His  speech is normal and behavior is normal.           Assessment & Plan:   1. Ear itching Recommend against use of Q-tips inside the ear canal. Ear hygiene reviewed. Try OTC Sweet Oil. If no improvement, try OTC topical steroid ointment.    Return if symptoms worsen or fail to improve.   Fernande Brashelle S. Skyelar Swigart, PA-C [facility] at [facility]

## 2017-05-15 NOTE — Patient Instructions (Addendum)
Please do not insert anything into your ear that is smaller than your elbow.  This includes QTips, keys, hair pins, etc.    If your ears are itchy, place several drops of Sweet Oil into each canal to soothe the itching.  If it's not effective, place a small amount of OTC hydrocortisone ointment on the tip of your pinky finger and rub it gently in the ear canal.  The heat of your body will melt the ointment, allowing it to spread in your ear canal and reduce the itching.  Please call your regular doctor's office to schedule a follow-up for your blood pressure.  IF you received an x-ray today, you will receive an invoice from [facility]. Please contact [facility] at [phone] with questions or concerns regarding your invoice.   IF you received labwork today, you will receive an invoice from [facility]. Please contact [facility] at [phone] with questions or concerns regarding your invoice.   Our billing staff will not be able to assist you with questions regarding bills from these companies.  You will be contacted with the lab results as soon as they are available. The fastest way to get your results is to activate your My Chart account. Instructions are located on the last page of this paperwork. If you have not heard from us regarding the results in 2 weeks, please contact this office.

## 2017-06-05 DIAGNOSIS — M79672 Pain in left foot: Secondary | ICD-10-CM | POA: Diagnosis not present

## 2017-12-09 DIAGNOSIS — E782 Mixed hyperlipidemia: Secondary | ICD-10-CM | POA: Diagnosis not present

## 2017-12-09 DIAGNOSIS — D582 Other hemoglobinopathies: Secondary | ICD-10-CM | POA: Diagnosis not present

## 2017-12-09 DIAGNOSIS — K219 Gastro-esophageal reflux disease without esophagitis: Secondary | ICD-10-CM | POA: Diagnosis not present

## 2017-12-09 DIAGNOSIS — Z0000 Encounter for general adult medical examination without abnormal findings: Secondary | ICD-10-CM | POA: Diagnosis not present

## 2017-12-17 DIAGNOSIS — E782 Mixed hyperlipidemia: Secondary | ICD-10-CM | POA: Diagnosis not present

## 2018-07-13 DIAGNOSIS — H9201 Otalgia, right ear: Secondary | ICD-10-CM | POA: Diagnosis not present

## 2018-12-02 DIAGNOSIS — H6502 Acute serous otitis media, left ear: Secondary | ICD-10-CM | POA: Diagnosis not present

## 2018-12-02 DIAGNOSIS — E782 Mixed hyperlipidemia: Secondary | ICD-10-CM | POA: Diagnosis not present

## 2018-12-02 DIAGNOSIS — D582 Other hemoglobinopathies: Secondary | ICD-10-CM | POA: Diagnosis not present

## 2018-12-02 DIAGNOSIS — Z0000 Encounter for general adult medical examination without abnormal findings: Secondary | ICD-10-CM | POA: Diagnosis not present

## 2024-01-07 DIAGNOSIS — Z0000 Encounter for general adult medical examination without abnormal findings: Secondary | ICD-10-CM | POA: Diagnosis not present

## 2024-01-07 DIAGNOSIS — R209 Unspecified disturbances of skin sensation: Secondary | ICD-10-CM | POA: Diagnosis not present

## 2024-01-07 DIAGNOSIS — E782 Mixed hyperlipidemia: Secondary | ICD-10-CM | POA: Diagnosis not present

## 2024-01-07 DIAGNOSIS — N400 Enlarged prostate without lower urinary tract symptoms: Secondary | ICD-10-CM | POA: Diagnosis not present

## 2024-01-07 DIAGNOSIS — K219 Gastro-esophageal reflux disease without esophagitis: Secondary | ICD-10-CM | POA: Diagnosis not present

## 2024-04-14 DIAGNOSIS — G629 Polyneuropathy, unspecified: Secondary | ICD-10-CM | POA: Diagnosis not present

## 2024-04-14 DIAGNOSIS — E782 Mixed hyperlipidemia: Secondary | ICD-10-CM | POA: Diagnosis not present

## 2024-04-14 DIAGNOSIS — E1140 Type 2 diabetes mellitus with diabetic neuropathy, unspecified: Secondary | ICD-10-CM | POA: Diagnosis not present

## 2024-04-14 DIAGNOSIS — E1169 Type 2 diabetes mellitus with other specified complication: Secondary | ICD-10-CM | POA: Diagnosis not present

## 2024-07-13 DIAGNOSIS — N400 Enlarged prostate without lower urinary tract symptoms: Secondary | ICD-10-CM | POA: Diagnosis not present

## 2024-07-13 DIAGNOSIS — E1169 Type 2 diabetes mellitus with other specified complication: Secondary | ICD-10-CM | POA: Diagnosis not present

## 2024-07-13 DIAGNOSIS — G629 Polyneuropathy, unspecified: Secondary | ICD-10-CM | POA: Diagnosis not present

## 2024-07-13 DIAGNOSIS — E782 Mixed hyperlipidemia: Secondary | ICD-10-CM | POA: Diagnosis not present
